# Patient Record
Sex: Male | Born: 1935 | Race: White | Hispanic: No | Marital: Married | Smoking: Former smoker
Health system: Southern US, Community
[De-identification: ages and names within clinical notes are randomized; demographics above are authoritative.]

## PROBLEM LIST (undated history)

## (undated) DIAGNOSIS — Z955 Presence of coronary angioplasty implant and graft: Secondary | ICD-10-CM

## (undated) DIAGNOSIS — M545 Low back pain: Secondary | ICD-10-CM

## (undated) DIAGNOSIS — I2510 Atherosclerotic heart disease of native coronary artery without angina pectoris: Secondary | ICD-10-CM

## (undated) DIAGNOSIS — G43909 Migraine, unspecified, not intractable, without status migrainosus: Secondary | ICD-10-CM

## (undated) DIAGNOSIS — K219 Gastro-esophageal reflux disease without esophagitis: Secondary | ICD-10-CM

## (undated) DIAGNOSIS — C61 Malignant neoplasm of prostate: Secondary | ICD-10-CM

## (undated) DIAGNOSIS — I10 Essential (primary) hypertension: Secondary | ICD-10-CM

## (undated) DIAGNOSIS — G8929 Other chronic pain: Secondary | ICD-10-CM

## (undated) DIAGNOSIS — M1990 Unspecified osteoarthritis, unspecified site: Secondary | ICD-10-CM

## (undated) DIAGNOSIS — C7951 Secondary malignant neoplasm of bone: Secondary | ICD-10-CM

## (undated) DIAGNOSIS — R42 Dizziness and giddiness: Secondary | ICD-10-CM

## (undated) DIAGNOSIS — E7800 Pure hypercholesterolemia, unspecified: Secondary | ICD-10-CM

## (undated) DIAGNOSIS — F32A Depression, unspecified: Secondary | ICD-10-CM

## (undated) DIAGNOSIS — F329 Major depressive disorder, single episode, unspecified: Secondary | ICD-10-CM

## (undated) DIAGNOSIS — R011 Cardiac murmur, unspecified: Secondary | ICD-10-CM

## (undated) DIAGNOSIS — F419 Anxiety disorder, unspecified: Secondary | ICD-10-CM

## (undated) HISTORY — DX: Unspecified osteoarthritis, unspecified site: M19.90

## (undated) HISTORY — DX: Presence of coronary angioplasty implant and graft: Z95.5

## (undated) HISTORY — DX: Dizziness and giddiness: R42

## (undated) HISTORY — DX: Cardiac murmur, unspecified: R01.1

## (undated) HISTORY — PX: TONSILLECTOMY: SUR1361

## (undated) HISTORY — DX: Gastro-esophageal reflux disease without esophagitis: K21.9

---

## 1959-04-19 HISTORY — PX: PILONIDAL CYST EXCISION: SHX744

## 2003-08-13 ENCOUNTER — Emergency Department (HOSPITAL_COMMUNITY): Admission: EM | Admit: 2003-08-13 | Discharge: 2003-08-13 | Payer: Self-pay | Admitting: Emergency Medicine

## 2003-12-05 ENCOUNTER — Emergency Department (HOSPITAL_COMMUNITY): Admission: EM | Admit: 2003-12-05 | Discharge: 2003-12-05 | Payer: Self-pay | Admitting: Emergency Medicine

## 2003-12-22 ENCOUNTER — Ambulatory Visit (HOSPITAL_COMMUNITY): Admission: RE | Admit: 2003-12-22 | Discharge: 2003-12-22 | Payer: Self-pay | Admitting: Otolaryngology

## 2005-08-18 HISTORY — PX: PROSTATECTOMY: SHX69

## 2006-05-22 ENCOUNTER — Emergency Department (HOSPITAL_COMMUNITY): Admission: EM | Admit: 2006-05-22 | Discharge: 2006-05-22 | Payer: Self-pay | Admitting: Family Medicine

## 2006-06-26 ENCOUNTER — Ambulatory Visit: Admission: RE | Admit: 2006-06-26 | Discharge: 2006-07-23 | Payer: Self-pay | Admitting: Radiation Oncology

## 2006-06-26 ENCOUNTER — Ambulatory Visit (HOSPITAL_COMMUNITY): Admission: RE | Admit: 2006-06-26 | Discharge: 2006-06-26 | Payer: Self-pay | Admitting: Urology

## 2006-08-05 ENCOUNTER — Inpatient Hospital Stay (HOSPITAL_COMMUNITY): Admission: RE | Admit: 2006-08-05 | Discharge: 2006-08-07 | Payer: Self-pay | Admitting: Urology

## 2006-08-05 ENCOUNTER — Encounter (INDEPENDENT_AMBULATORY_CARE_PROVIDER_SITE_OTHER): Payer: Self-pay | Admitting: Specialist

## 2007-04-03 IMAGING — CR DG CHEST 2V
2 series · 2 of 2 positions shown · non-contrast
Comparison: None.

CLINICAL DATA: Prostate cancer. Preoperative. 
 CHEST - 2 VIEW:

[view not recorded (1 of 2)]
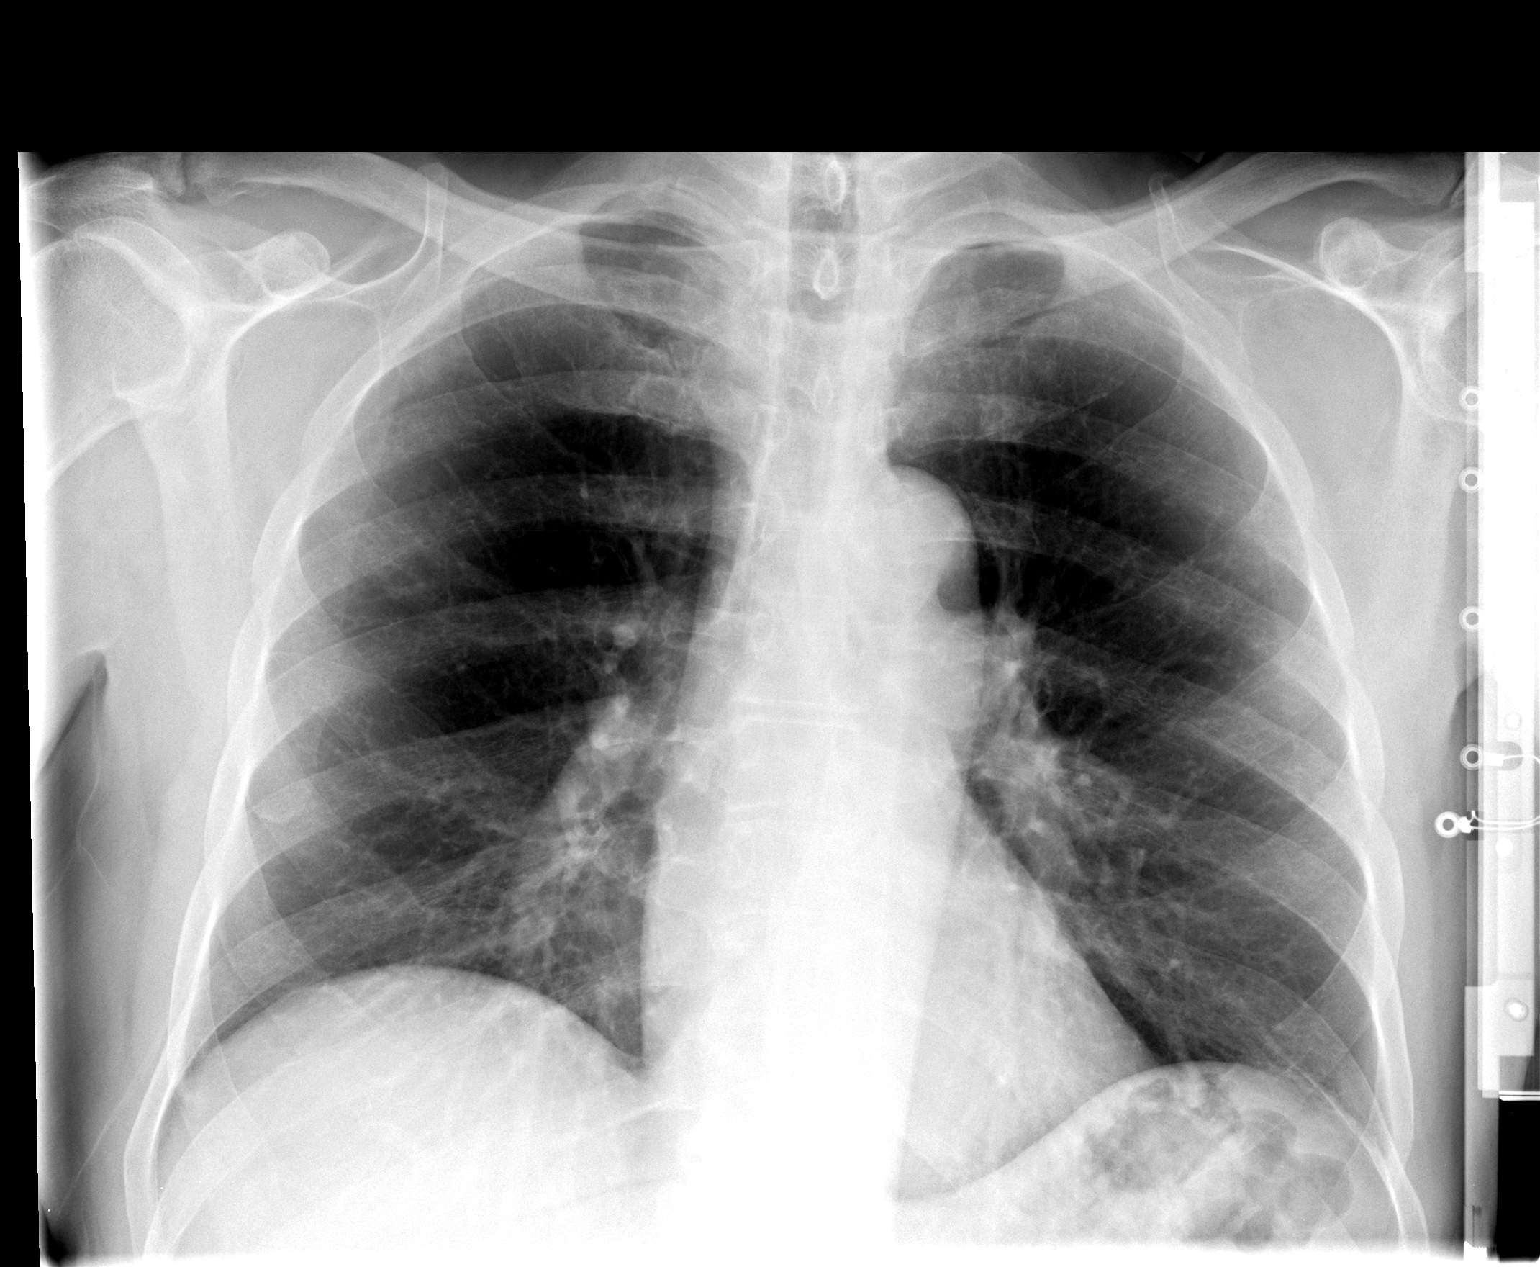

[view not recorded (2 of 2)]
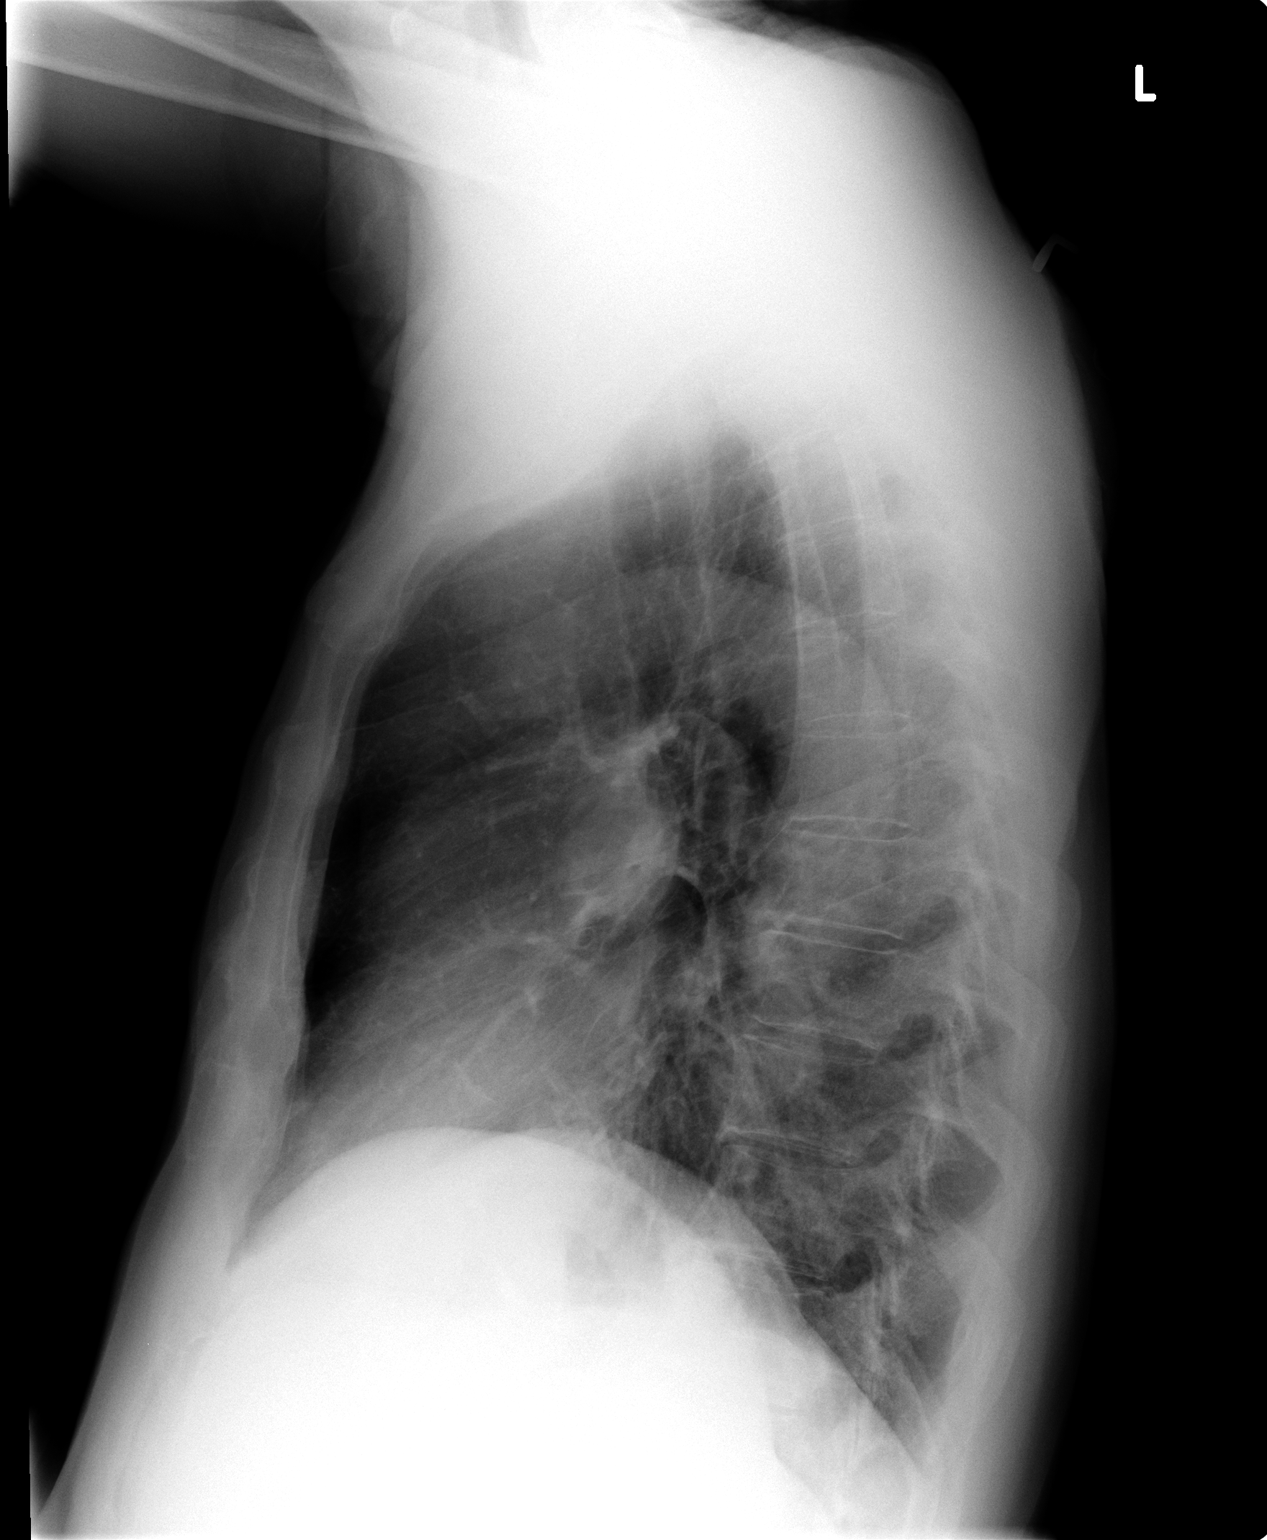

[2 of 2 positions shown; findings below may reference images not displayed]

FINDINGS: The heart size and mediastinal contours are within normal limits.  Both lungs are clear.  The visualized skeletal structures are unremarkable. Degenerative changes noted at the right acromioclavicular joint.
IMPRESSION: No active cardiopulmonary disease.

## 2007-12-17 ENCOUNTER — Emergency Department (HOSPITAL_COMMUNITY): Admission: EM | Admit: 2007-12-17 | Discharge: 2007-12-17 | Payer: Self-pay | Admitting: Emergency Medicine

## 2010-11-05 ENCOUNTER — Emergency Department (HOSPITAL_BASED_OUTPATIENT_CLINIC_OR_DEPARTMENT_OTHER): Payer: Federal, State, Local not specified - PPO

## 2010-11-05 ENCOUNTER — Emergency Department (HOSPITAL_BASED_OUTPATIENT_CLINIC_OR_DEPARTMENT_OTHER)
Admission: EM | Admit: 2010-11-05 | Discharge: 2010-11-05 | Disposition: A | Discharge status: Home / Self Care | Payer: Federal, State, Local not specified - PPO | Attending: Emergency Medicine | Admitting: Emergency Medicine

## 2010-11-05 DIAGNOSIS — Z79899 Other long term (current) drug therapy: Secondary | ICD-10-CM | POA: Insufficient documentation

## 2010-11-05 DIAGNOSIS — M25529 Pain in unspecified elbow: Secondary | ICD-10-CM

## 2010-11-05 DIAGNOSIS — IMO0002 Reserved for concepts with insufficient information to code with codable children: Secondary | ICD-10-CM | POA: Insufficient documentation

## 2010-11-05 DIAGNOSIS — Y92009 Unspecified place in unspecified non-institutional (private) residence as the place of occurrence of the external cause: Secondary | ICD-10-CM | POA: Insufficient documentation

## 2011-01-03 NOTE — Op Note (Signed)
NAME:  Frank Petersen, MIMBS NO.:  [id number]   MEDICAL RECORD NO.:  [id number]          PATIENT TYPE:  INP   LOCATION:  1437                         FACILITY:  [facility]   PHYSICIAN:  Lucrezia Starch. Earlene Plater, M.D.  DATE OF BIRTH:  Sep 20, 1932   DATE OF PROCEDURE:  08/05/2006  DATE OF DISCHARGE:                               OPERATIVE REPORT   DIAGNOSIS:  Adenocarcinoma of prostate.   OPERATIVE PROCEDURE:  Robotic radical prostatectomy with bilateral  pelvic lymphadenectomy.   SURGEON:  Lucrezia Starch. Earlene Plater, M.D.   ASSISTANT:  Crecencio Mc, M.D.   ANESTHESIA:  General endotracheal.   ESTIMATED BLOOD LOSS:  150 mL.   TUBES:  Large round Blake drain, 20-French Coude catheter.   COMPLICATIONS:  None.   INDICATIONS FOR PROCEDURE:  Mr. Millirons is a very nice 77 year old white  male who presented to Dr. Payton Mccallum where he was found to have an  elevated PSA of 6.45 and a palpable abnormality on the right side of his  prostate. Biopsies revealed a Gleason score 7 which was 3+4  adenocarcinoma bilaterally.  He is quite healthy and his metastatic  workup was negative.  After understanding risks, benefits and  alternatives, he has elected to proceed with robotic radical  prostatectomy.   PROCEDURE IN DETAIL:  The patient was placed in the supine position.  After proper general endotracheal anesthesia, he was placed in the  dorsal lithotomy position, prepped and prepped with Betadine in a  sterile fashion.  A 22-French Foley catheter was inserted with a 30 mL  balloon and the bladder was drained.  A left periumbilical incision was  made approximately 1 cm in length.  The peritoneum was entered and a 8-  [place] port was placed in the [place] technique, insufflated with carbon  dioxide.  The abdomen was then examined. He was noted to have some  adhesions in the right pelvis and, also, some adhesions to the right  lower quadrant. These were taken down with scissors after a 5-mm  port  had been placed just superior and lateral under vision into the  peritoneal cavity.  The areas were cauterized with Bovie coagulation  cautery.  Next, robotic ports were placed one handbreadth laterally  bilaterally, approximately 16 cm from the pubic symphysis, the  periumbilical port was approximately 18 cm from pubic symphysis and  visualized in place.  A third arm port was placed lateral to the left  robotic arm port and another 12 mm port was placed lateral to the right  robotic arm port.  The robot was attached and dissection was begun.  Approximately 200 mL of sterile water was placed into the bladder.   The bladder flap was taken down along with the median and medial  umbilical ligaments to the space of Retzius.  This was entered and  dissected free.  The endopelvic fascia was incised bilaterally and  sections were carried down to the dorsal vein complex. Pudendal vessels  were taken down sharply and with Bovie coagulation cautery.  The dorsal  vein complex was then clamped with an Endo-GIA stapler and cut and noted  to be in good position.  The bladder neck was then approached and  sharply dissected to the catheter.  The catheter was used as a traction  device.  The posterior bladder neck was taken down.  The seminal  vesicles and ampulla of the vas deferens were taken down and used as  traction devices.  Each pedicle was identified.  The patient has had  erectile problems, has bilateral tumor.  However, the neurovascular  bundle appeared to be not involved and came down easily bilaterally.  The pedicles were taken in serial clamps with Hem-A-Lock clips and taken  up to the apex of the prostate maintaining the pedicle and what appeared  to be a wide margin on the prostate.  The apex was then approached and  dissected.  The ureter was sharply cut anteriorly and posteriorly. The  posterior urethral plate was taken down. The specimen was freed and  placed in the peritoneal  cavity.  The pelvis was filled with irrigation  and the rectum was insufflated and there were no leaks noted.  Good  hemostasis was noted to be present.   Bilateral pelvic lymphadenectomy was performed under direct vision.  The  obturator and external iliac lymph nodes were taken down, clipped both  proximally and distally, obturator nerves were identified and protected  bilaterally.  The specimens were obtained with the cup device and  submitted to pathology.  The urethrovesical anastomosis was then  performed.  The bladder neck appeared to be very small and the same size  as the urethra.  A holding stitch was placed with 2-0 Vicryl suture  posteriorly and the suture was performed with 3-0 Monocryl utilizing an  RB1 needle. The dyed and undyed segments in the knot were placed  posterior to the bladder neck. Running sutures were performed and tied  anteriorly.  The bladder was irrigated clear.  There was no leakage  noted.  Good hemostasis was noted to be present. Under direct vision,  the specimen was captured with an EndoCatch bag through the  periumbilical port.  The 12 mm right port was closed under direct vision  with a suture passer and 2-0 Vicryl suture.  Each port was visualized as  it was removed and there was good hemostasis.  The specimen was then  removed through the periumbilical incision and the fascia was closed  with running 2-0 Vicryl suture.  Each wound was injected with 0.25%  Marcaine and closed with skin staples.  The patient was taken to the  recovery room stable.      Ronald L. Earlene Plater, M.D.  Electronically Signed     RLD/MEDQ  D:  08/05/2006  T:  08/05/2006  Job:  [id number]

## 2011-01-03 NOTE — H&P (Signed)
NAME:  Frank, Petersen NO.:  [id number]   MEDICAL RECORD NO.:  [id number]          PATIENT TYPE:  INP   LOCATION:  X001                         FACILITY:  [facility]   PHYSICIAN:  Lucrezia Starch. Earlene Plater, M.D.  DATE OF BIRTH:  03-31-1933   DATE OF ADMISSION:  08/05/2006  DATE OF DISCHARGE:                              HISTORY & PHYSICAL   CHIEF COMPLAINT:  I have prostate cancer.   HISTORY OF PRESENT ILLNESS:  Frank Petersen is a very nice 77 year old,  white male who presented with an elevated PSA of 6.45.  He also had a  palpably abnormal prostate with some firmness on the right side.  He  underwent biopsy of the prostate which revealed a Gleason's score 7  which was 3 + 4 adenocarcinoma bilaterally.  His metastatic workup was  negative and, after understanding risks, benefits, and alternatives,  elected to proceed with robotic radical prostatectomy and pelvic  lymphadenectomy.   PAST MEDICAL HISTORY:  He is ALLERGIC TO CODEINE AND ASPIRIN.   MEDICATIONS:  Include:  1. Crestor.  2. Lorazepam.  3. Promethazine.  4. Protonix.  5. Sertraline.  6. Topamax.  7. B-complex vitamins.   ILLNESSES:  He has had a heart murmur, hiatal hernia with reflux,  arthritis, and some inner ear vertigo with migraines.   SURGERIES:  He has had a tonsillectomy and a pilonidal cystectomy in the  1960s.   SOCIAL HISTORY:  He has 2 beers per day, negative tobacco.   FAMILY HISTORY:  Nonsignificant.   REVIEW OF SYSTEMS:  He has no shortness of breath, dyspnea on exertion,  chest pain, or GI complaints.   PHYSICAL EXAMINATION:  Blood pressure is 125/73, pulse 64, respirations  16, temperature 97.3 degrees Fahrenheit.  GENERAL:  He is a well-nourished, well-developed, well-groomed, oriented  x3.  HEAD, EYES, EARS, NOSE:  Normal.  NECK:  Without masses or thyromegaly.  CHEST:  Normal diaphragmatic motion.  ABDOMEN:  Soft, nontender without masses, organomegaly, or hernias.  EXTREMITIES:   Normal.  NEURO:  Intact.  SKIN:  Normal.  Penis, meatus, scrotum, testicle, adnexa, anus, perineum are normal.  RECTAL:  __________ prostate 35 g, some firmness noted on the right  side.   IMPRESSION:  Clinical stage T2 adenocarcinoma of the prostate, Gleason's  score 7.   PLAN:  Radical robotic prostatectomy with bilateral pelvic  lymphadenopathy.      Ronald L. Earlene Plater, M.D.  Electronically Signed     RLD/MEDQ  D:  08/05/2006  T:  08/05/2006  Job:  [id number]

## 2012-03-18 ENCOUNTER — Emergency Department (HOSPITAL_BASED_OUTPATIENT_CLINIC_OR_DEPARTMENT_OTHER)
Admission: EM | Admit: 2012-03-18 | Discharge: 2012-03-18 | Disposition: A | Discharge status: Home / Self Care | Payer: Federal, State, Local not specified - PPO | Attending: Emergency Medicine | Admitting: Emergency Medicine

## 2012-03-18 DIAGNOSIS — Y849 Medical procedure, unspecified as the cause of abnormal reaction of the patient, or of later complication, without mention of misadventure at the time of the procedure: Secondary | ICD-10-CM | POA: Insufficient documentation

## 2012-03-18 DIAGNOSIS — T8131XA Disruption of external operation (surgical) wound, not elsewhere classified, initial encounter: Secondary | ICD-10-CM | POA: Insufficient documentation

## 2012-03-18 DIAGNOSIS — L918 Other hypertrophic disorders of the skin: Secondary | ICD-10-CM | POA: Insufficient documentation

## 2012-03-18 DIAGNOSIS — Z85820 Personal history of malignant melanoma of skin: Secondary | ICD-10-CM | POA: Insufficient documentation

## 2012-03-18 NOTE — ED Provider Notes (Signed)
Medical screening examination/treatment/procedure(s) were conducted as a shared visit with non-physician practitioner(s) and myself.  I personally evaluated the patient during the encounter  S/p melanoma excision, sutures removed 2 days ago at [place]. Wound dehiscence today. Healed wound edges with granulation tissue. No evidence of infection. Needs to see surgeon.  Glynn Octave, MD 03/18/12 [phone]

## 2012-03-18 NOTE — ED Notes (Signed)
Pt reports skin biopsy on 02/15/2012, sutures removed 2 days ago, pt states he bent over this morning and felt "a tightness". bandaid removed from site to right upper scapula to reveal approx 3 inch open wound with tissue visible. No drainage, bleeding, or redness noted. Pt denies any pain.

## 2012-03-18 NOTE — ED Provider Notes (Signed)
History     CSN: [id number]  Arrival date & time 03/18/12  1157   First MD Initiated Contact with Patient 03/18/12 1223      Chief Complaint  Patient presents with  . Wound Check    (Consider location/radiation/quality/duration/timing/severity/associated sxs/prior treatment) Patient is a 78 y.o. male presenting with wound check. The history is provided by the patient. No language interpreter was used.  Wound Check  Treated in ED: melanoma removal 4 weeks ago, surgery 2 weeks ago. sutures removed 2 days ago.     Wound reopened last pm.   No past medical history on file.  No past surgical history on file.  No family history on file.  History  Substance Use Topics  . Smoking status: Not on file  . Smokeless tobacco: Not on file  . Alcohol Use: Not on file      Review of Systems  Skin: Positive for wound.  All other systems reviewed and are negative.    Allergies  Codeine  Home Medications   Current Outpatient Rx  Name Route Sig Dispense Refill  . CLONAZEPAM 0.5 MG PO TABS Oral Take 0.5 mg by mouth 2 (two) times daily as needed.    Marland Kitchen HYDROCODONE-ACETAMINOPHEN 5-500 MG PO TABS Oral Take 1 tablet by mouth every 6 (six) hours as needed.    Marland Kitchen MECLIZINE HCL 12.5 MG PO TABS Oral Take 12.5 mg by mouth 3 (three) times daily as needed.    Marland Kitchen METFORMIN HCL 500 MG PO TABS Oral Take 500 mg by mouth 2 (two) times daily with a meal.    . NAPROXEN 500 MG PO TABS Oral Take 500 mg by mouth 2 (two) times daily with a meal.    . OMEPRAZOLE 20 MG PO CPDR Oral Take 20 mg by mouth daily.    Marland Kitchen ROSUVASTATIN CALCIUM 20 MG PO TABS Oral Take 20 mg by mouth daily.    . SERTRALINE HCL 100 MG PO TABS Oral Take 100 mg by mouth daily.      BP 167/95  Pulse 67  Temp 98.2 F (36.8 C) (Oral)  Resp 18  SpO2 100%  Physical Exam  Nursing note and vitals reviewed. Constitutional: He is oriented to person, place, and time. He appears well-developed and well-nourished.  Musculoskeletal: He  exhibits tenderness.       3 inch long, open granulation tissue wound, wound edges healed,  Neurological: He is alert and oriented to person, place, and time. He has normal reflexes.  Skin: There is erythema.  Psychiatric: He has a normal mood and affect.    ED Course  Procedures (including critical care time)  Labs Reviewed - No data to display No results found.   No diagnosis found.    MDM  Pt seen by Dr. Manus Gunning, He advised clean and steri strip,   Pt advised to call his surgeon to be seen.   I do not think resuturing will be of benefit due to healed and granulation tissue,  Wound cleaned and steristrips         Lonia Skinner [place], [place] 03/18/12 1333

## 2012-12-03 ENCOUNTER — Telehealth (HOSPITAL_COMMUNITY): Payer: Self-pay | Admitting: *Deleted

## 2012-12-03 ENCOUNTER — Emergency Department (HOSPITAL_COMMUNITY)
Admission: EM | Admit: 2012-12-03 | Discharge: 2012-12-03 | Disposition: A | Discharge status: Home / Self Care | Payer: Federal, State, Local not specified - PPO | Source: Home / Self Care | Attending: Family Medicine | Admitting: Family Medicine

## 2012-12-03 ENCOUNTER — Encounter (HOSPITAL_COMMUNITY): Payer: Self-pay | Admitting: Emergency Medicine

## 2012-12-03 DIAGNOSIS — H1010 Acute atopic conjunctivitis, unspecified eye: Secondary | ICD-10-CM

## 2012-12-03 DIAGNOSIS — H1013 Acute atopic conjunctivitis, bilateral: Secondary | ICD-10-CM

## 2012-12-03 DIAGNOSIS — J309 Allergic rhinitis, unspecified: Secondary | ICD-10-CM

## 2012-12-03 LAB — POCT RAPID STREP A: Streptococcus, Group A Screen (Direct): NEGATIVE

## 2012-12-03 MED ORDER — AZITHROMYCIN 250 MG PO TABS: ORAL_TABLET | ORAL | Status: DC

## 2012-12-03 MED ORDER — FEXOFENADINE HCL 180 MG PO TABS: 180.0000 mg | ORAL_TABLET | Freq: Every day | ORAL | Status: DC

## 2012-12-03 MED ORDER — FLUTICASONE PROPIONATE 50 MCG/ACT NA SUSP: 1.0000 | Freq: Two times a day (BID) | NASAL | Status: DC

## 2012-12-03 MED ORDER — OLOPATADINE HCL 0.1 % OP SOLN: 1.0000 [drp] | Freq: Two times a day (BID) | OPHTHALMIC | Status: DC

## 2012-12-03 NOTE — ED Provider Notes (Signed)
History     CSN: [id number]  Arrival date & time 12/03/12  1002   First MD Initiated Contact with Patient 12/03/12 1021      Chief Complaint  Patient presents with  . URI    (Consider location/radiation/quality/duration/timing/severity/associated sxs/prior treatment) Patient is a 79 y.o. male presenting with URI. The history is provided by the patient.  URI Presenting symptoms: congestion, cough, rhinorrhea and sore throat   Presenting symptoms: no fever   Severity:  Mild Duration:  6 days Progression:  Unchanged Chronicity:  New Associated symptoms: sneezing   Associated symptoms: no wheezing     History reviewed. No pertinent past medical history.  History reviewed. No pertinent past surgical history.  History reviewed. No pertinent family history.  History  Substance Use Topics  . Smoking status: Never Smoker   . Smokeless tobacco: Not on file  . Alcohol Use: No      Review of Systems  Constitutional: Negative.  Negative for fever.  HENT: Positive for congestion, sore throat, rhinorrhea and sneezing.   Respiratory: Positive for cough. Negative for wheezing.   Cardiovascular: Negative.   Gastrointestinal: Negative.     Allergies  Codeine  Home Medications   Current Outpatient Rx  Name  Route  Sig  Dispense  Refill  . azithromycin (ZITHROMAX Z-PAK) 250 MG tablet      Take as directed on pack   6 each   0   . clonazePAM (KLONOPIN) 0.5 MG tablet   Oral   Take 0.5 mg by mouth 2 (two) times daily as needed.         . fexofenadine (ALLEGRA) 180 MG tablet   Oral   Take 1 tablet (180 mg total) by mouth daily.   30 tablet   1   . fluticasone (FLONASE) 50 MCG/ACT nasal spray   Nasal   Place 1 spray into the nose 2 (two) times daily.   1 g   2   . HYDROcodone-acetaminophen (VICODIN) 5-500 MG per tablet   Oral   Take 1 tablet by mouth every 6 (six) hours as needed.         . meclizine (ANTIVERT) 12.5 MG tablet   Oral   Take 12.5 mg by  mouth 3 (three) times daily as needed.         . metFORMIN (GLUCOPHAGE) 500 MG tablet   Oral   Take 500 mg by mouth 2 (two) times daily with a meal.         . naproxen (NAPROSYN) 500 MG tablet   Oral   Take 500 mg by mouth 2 (two) times daily with a meal.         . olopatadine (PATANOL) 0.1 % ophthalmic solution   Both Eyes   Place 1 drop into both eyes 2 (two) times daily.   5 mL   12   . omeprazole (PRILOSEC) 20 MG capsule   Oral   Take 20 mg by mouth daily.         . rosuvastatin (CRESTOR) 20 MG tablet   Oral   Take 20 mg by mouth daily.         . sertraline (ZOLOFT) 100 MG tablet   Oral   Take 100 mg by mouth daily.           BP 132/74  Pulse 80  Temp(Src) 97.8 F (36.6 C) (Oral)  Resp 20  SpO2 98%  Physical Exam  Nursing note and vitals reviewed. Constitutional: He is  oriented to person, place, and time. He appears well-developed and well-nourished.  HENT:  Head: Normocephalic.  Right Ear: External ear normal.  Left Ear: External ear normal.  Nose: Mucosal edema and rhinorrhea present.  Mouth/Throat: Oropharynx is clear and moist.  Eyes: EOM are normal. Pupils are equal, round, and reactive to light. Right conjunctiva is injected. Left conjunctiva is injected.  Neck: Neck supple.  Cardiovascular: Regular rhythm and normal heart sounds.   Pulmonary/Chest: Effort normal and breath sounds normal.  Lymphadenopathy:    He has no cervical adenopathy.  Neurological: He is alert and oriented to person, place, and time.  Skin: Skin is warm and dry.    ED Course  Procedures (including critical care time)  Labs Reviewed  POCT RAPID STREP A ([facility] CARE ONLY)   No results found.   1. Allergic conjunctivitis and rhinitis, bilateral       MDM          Linna Hoff, MD 12/03/12 1049

## 2012-12-03 NOTE — ED Notes (Signed)
Pt c/o sx including sore throat, runny nose, productive cough with yellowish greenish phelgm, and lack of energy x 6 days. Denies fever. Last night felt tightness in chest that made him SOB. Tried using [employer] with no relief. Patient is alert and oriented.

## 2012-12-03 NOTE — ED Notes (Signed)
Pt.'s wife called x 2 on VM and said he signed a medical release form and wants report on his condition.  I verified release form was signed and called her back. She thought he had some kind of flu or other URI and the d/c paper say allergic conjunctivitis and rhinitis. I told her his strep screen was negative but the doctor must have thought he had some kind of URI because he gave him a Z-pak. She said he filled all the Rx.'s except the Patanol because it was to expensive. She said she used 2 drops of her Tobramycin eye drops she had left over. I did not recommend she do that.  I told her I could try to get the Patanol changed to a different eye drop that was cheaper and she said no she will just watch it. I told her if he is not better after the Z-pak, Flonase and Allegra to f/u with his PCP. He can use Delsym for the cough.  She voiced understanding. Frank Petersen 12/03/2012

## 2013-08-31 ENCOUNTER — Encounter: Payer: Self-pay | Admitting: Cardiovascular Disease

## 2013-08-31 ENCOUNTER — Encounter: Payer: Self-pay | Admitting: *Deleted

## 2013-08-31 DIAGNOSIS — R42 Dizziness and giddiness: Secondary | ICD-10-CM | POA: Insufficient documentation

## 2013-08-31 DIAGNOSIS — M1990 Unspecified osteoarthritis, unspecified site: Secondary | ICD-10-CM | POA: Insufficient documentation

## 2013-08-31 DIAGNOSIS — K219 Gastro-esophageal reflux disease without esophagitis: Secondary | ICD-10-CM | POA: Insufficient documentation

## 2013-08-31 DIAGNOSIS — C61 Malignant neoplasm of prostate: Secondary | ICD-10-CM | POA: Insufficient documentation

## 2013-08-31 DIAGNOSIS — R011 Cardiac murmur, unspecified: Secondary | ICD-10-CM | POA: Insufficient documentation

## 2013-09-01 ENCOUNTER — Ambulatory Visit: Payer: Federal, State, Local not specified - PPO | Admitting: Cardiovascular Disease

## 2014-04-27 ENCOUNTER — Emergency Department (HOSPITAL_BASED_OUTPATIENT_CLINIC_OR_DEPARTMENT_OTHER): Payer: Federal, State, Local not specified - PPO

## 2014-04-27 ENCOUNTER — Encounter (HOSPITAL_BASED_OUTPATIENT_CLINIC_OR_DEPARTMENT_OTHER): Payer: Self-pay | Admitting: Emergency Medicine

## 2014-04-27 ENCOUNTER — Emergency Department (HOSPITAL_BASED_OUTPATIENT_CLINIC_OR_DEPARTMENT_OTHER)
Admission: EM | Admit: 2014-04-27 | Discharge: 2014-04-27 | Disposition: A | Discharge status: Home / Self Care | Payer: Federal, State, Local not specified - PPO | Attending: Emergency Medicine | Admitting: Emergency Medicine

## 2014-04-27 DIAGNOSIS — H8309 Labyrinthitis, unspecified ear: Secondary | ICD-10-CM | POA: Insufficient documentation

## 2014-04-27 DIAGNOSIS — R011 Cardiac murmur, unspecified: Secondary | ICD-10-CM | POA: Insufficient documentation

## 2014-04-27 DIAGNOSIS — M129 Arthropathy, unspecified: Secondary | ICD-10-CM | POA: Insufficient documentation

## 2014-04-27 DIAGNOSIS — IMO0002 Reserved for concepts with insufficient information to code with codable children: Secondary | ICD-10-CM | POA: Insufficient documentation

## 2014-04-27 DIAGNOSIS — Z792 Long term (current) use of antibiotics: Secondary | ICD-10-CM | POA: Insufficient documentation

## 2014-04-27 DIAGNOSIS — H9312 Tinnitus, left ear: Secondary | ICD-10-CM

## 2014-04-27 DIAGNOSIS — K219 Gastro-esophageal reflux disease without esophagitis: Secondary | ICD-10-CM | POA: Insufficient documentation

## 2014-04-27 DIAGNOSIS — Z791 Long term (current) use of non-steroidal anti-inflammatories (NSAID): Secondary | ICD-10-CM | POA: Insufficient documentation

## 2014-04-27 DIAGNOSIS — H8302 Labyrinthitis, left ear: Secondary | ICD-10-CM

## 2014-04-27 DIAGNOSIS — H60399 Other infective otitis externa, unspecified ear: Secondary | ICD-10-CM | POA: Insufficient documentation

## 2014-04-27 DIAGNOSIS — Z79899 Other long term (current) drug therapy: Secondary | ICD-10-CM | POA: Insufficient documentation

## 2014-04-27 DIAGNOSIS — Z8546 Personal history of malignant neoplasm of prostate: Secondary | ICD-10-CM | POA: Insufficient documentation

## 2014-04-27 DIAGNOSIS — H6092 Unspecified otitis externa, left ear: Secondary | ICD-10-CM

## 2014-04-27 DIAGNOSIS — H9209 Otalgia, unspecified ear: Secondary | ICD-10-CM | POA: Insufficient documentation

## 2014-04-27 DIAGNOSIS — H9319 Tinnitus, unspecified ear: Secondary | ICD-10-CM | POA: Insufficient documentation

## 2014-04-27 DIAGNOSIS — R42 Dizziness and giddiness: Secondary | ICD-10-CM | POA: Insufficient documentation

## 2014-04-27 LAB — CBC
HEMATOCRIT: 40 % (ref 39.0–52.0)
HEMOGLOBIN: 13.7 g/dL (ref 13.0–17.0)
MCH: 28.9 pg (ref 26.0–34.0)
MCHC: 34.3 g/dL (ref 30.0–36.0)
MCV: 84.4 fL (ref 78.0–100.0)
Platelets: 312 10*3/uL (ref 150–400)
RBC: 4.74 MIL/uL (ref 4.22–5.81)
RDW: 13.1 % (ref 11.5–15.5)
WBC: 2.3 10*3/uL — ABNORMAL LOW (ref 4.0–10.5)

## 2014-04-27 LAB — BASIC METABOLIC PANEL
Anion gap: 12 (ref 5–15)
BUN: 23 mg/dL (ref 6–23)
CALCIUM: 8.9 mg/dL (ref 8.4–10.5)
CO2: 24 mEq/L (ref 19–32)
CREATININE: 0.7 mg/dL (ref 0.50–1.35)
Chloride: 100 mEq/L (ref 96–112)
GFR calc Af Amer: >90 mL/min (ref >90)
GFR, EST NON AFRICAN AMERICAN: 87 mL/min — AB (ref >90)
GLUCOSE: 135 mg/dL — AB (ref 70–99)
Potassium: 4.4 mEq/L (ref 3.7–5.3)
Sodium: 136 mEq/L — ABNORMAL LOW (ref 137–147)

## 2014-04-27 MED ORDER — CIPROFLOXACIN-HYDROCORTISONE 0.2-1 % OT SUSP: 3.0000 [drp] | Freq: Two times a day (BID) | OTIC | Status: DC

## 2014-04-27 MED ORDER — SODIUM CHLORIDE 0.9 % IV BOLUS (SEPSIS)
500.0000 mL | Freq: Once | INTRAVENOUS | Status: AC
Start: 2014-04-27 — End: 2014-04-27
  Administered 2014-04-27: 500 mL via INTRAVENOUS

## 2014-04-27 MED ORDER — DIAZEPAM 5 MG PO TABS: 5.0000 mg | ORAL_TABLET | Freq: Four times a day (QID) | ORAL | Status: DC | PRN

## 2014-04-27 MED ORDER — MECLIZINE HCL 25 MG PO TABS
25.0000 mg | ORAL_TABLET | Freq: Once | ORAL | Status: AC
  Administered 2014-04-27: 25 mg via ORAL
  Filled 2014-04-27: qty 1

## 2014-04-27 NOTE — ED Notes (Signed)
MD at bedside. 

## 2014-04-27 NOTE — ED Provider Notes (Signed)
CSN: [id number]     Arrival date & time 04/27/14  1616 History   First MD Initiated Contact with Patient 04/27/14 1653     Chief Complaint  Patient presents with  . Otalgia     (Consider location/radiation/quality/duration/timing/severity/associated sxs/prior Treatment) Patient is a 80 y.o. male presenting with ear pain. The history is provided by the patient.  Otalgia Location:  Left Behind ear:  No abnormality Quality: tinnitus. Severity:  Mild Onset quality:  Gradual Timing:  Intermittent Progression:  Improving Chronicity:  New Relieved by: finger manipulating ear canal. Worsened by:  Nothing tried Associated symptoms: no cough, no fever and no vomiting     Past Medical History  Diagnosis Date  . Prostate cancer   . Heart murmur   . Hiatal hernia   . GERD (gastroesophageal reflux disease)   . Arthritis   . Vertigo    Past Surgical History  Procedure Laterality Date  . Prostatectomy    . Tonsillectomy    . Cystectomy     No family history on file. History  Substance Use Topics  . Smoking status: Never Smoker   . Smokeless tobacco: Not on file  . Alcohol Use: No    Review of Systems  Constitutional: Negative for fever.  HENT: Positive for ear pain.   Respiratory: Negative for cough and shortness of breath.   Gastrointestinal: Negative for vomiting.  All other systems reviewed and are negative.     Allergies  Codeine  Home Medications   Prior to Admission medications   Medication Sig Start Date End Date Taking? Authorizing Provider  azithromycin (ZITHROMAX Z-PAK) 250 MG tablet Take as directed on pack 12/03/12   Billy Fischer, MD  clonazePAM (KLONOPIN) 0.5 MG tablet Take 0.5 mg by mouth 2 (two) times daily as needed.    Historical Provider, MD  fexofenadine (ALLEGRA) 180 MG tablet Take 1 tablet (180 mg total) by mouth daily. 12/03/12   Billy Fischer, MD  fluticasone (FLONASE) 50 MCG/ACT nasal spray Place 1 spray into the nose 2 (two) times daily.  12/03/12   Billy Fischer, MD  HYDROcodone-acetaminophen (VICODIN) 5-500 MG per tablet Take 1 tablet by mouth every 6 (six) hours as needed.    Historical Provider, MD  meclizine (ANTIVERT) 12.5 MG tablet Take 12.5 mg by mouth 3 (three) times daily as needed.    Historical Provider, MD  metFORMIN (GLUCOPHAGE) 500 MG tablet Take 500 mg by mouth 2 (two) times daily with a meal.    Historical Provider, MD  naproxen (NAPROSYN) 500 MG tablet Take 500 mg by mouth 2 (two) times daily with a meal.    Historical Provider, MD  olopatadine (PATANOL) 0.1 % ophthalmic solution Place 1 drop into both eyes 2 (two) times daily. 12/03/12   Billy Fischer, MD  omeprazole (PRILOSEC) 20 MG capsule Take 20 mg by mouth daily.    Historical Provider, MD  rosuvastatin (CRESTOR) 20 MG tablet Take 20 mg by mouth daily.    Historical Provider, MD  sertraline (ZOLOFT) 100 MG tablet Take 100 mg by mouth daily.    Historical Provider, MD   BP 117/81  Pulse 121  Temp(Src) 97.9 F (36.6 C) (Oral)  Resp 20  Wt 176 lb (79.833 kg)  SpO2 100% Physical Exam  Constitutional: He is oriented to person, place, and time. He appears well-developed and well-nourished. No distress.  HENT:  Head: Normocephalic and atraumatic.  Mouth/Throat: No oropharyngeal exudate.  L TM with hair in canal, L  canal redness, TM appears well.  Eyes: EOM are normal. Pupils are equal, round, and reactive to light.  Neck: Normal range of motion. Neck supple.  Cardiovascular: Normal rate and regular rhythm.  Exam reveals no friction rub.   No murmur heard. Pulmonary/Chest: Effort normal and breath sounds normal. No respiratory distress. He has no wheezes. He has no rales.  Abdominal: He exhibits no distension. There is no tenderness. There is no rebound.  Musculoskeletal: Normal range of motion. He exhibits no edema.  Neurological: He is alert and oriented to person, place, and time.  Skin: He is not diaphoretic.    ED Course  Procedures (including  critical care time) Labs Review Labs Reviewed - No data to display  Imaging Review Ct Head Wo Contrast  04/27/2014   CLINICAL DATA:  Dizziness.  Tinnitus.  EXAM: CT HEAD WITHOUT CONTRAST  TECHNIQUE: Contiguous axial images were obtained from the base of the skull through the vertex without intravenous contrast.  COMPARISON:  12/05/2003  FINDINGS: There is no evidence of intracranial hemorrhage, brain edema, or other signs of acute infarction. There is no evidence of intracranial mass lesion or mass effect. No abnormal extraaxial fluid collections are identified.  Mild diffuse cerebral atrophy shows mild progression since previous exam. Ventricles are normal in size. No other intracranial abnormality identified. No skull fracture or other bone lesions identified.  IMPRESSION: No acute intracranial abnormality.  Mild cerebral atrophy.   Electronically Signed   By: Earle Gell M.D.   On: 04/27/2014 18:51     EKG Interpretation None      MDM   Final diagnoses:  Labyrinthitis of left ear  Tinnitus, left  Otitis externa, left    46M here with tinnitus, dizziness in L ear. Began earlier today. Improved after taking our hearing aid and putting finger in ear. Hx of prostate cancer, about a weekout from chemo. Review of his chemo drug does not show tinnitus as a side effect. Mild vertigo type dizziness with this. Will CT his head to ensure no intra-cranial issue. Ear canal is red c/w otitis externa. Some relief with meclizine and after cleaning out of his ear canal, but his tinnitus returned. Workup negative. I will refrain from steroids for possible labyrinthitis since he's on chemo. He's followed by Dr. Janace Hoard with ENT, recommended f/u with him soon. No bruits, improvement with manipulation of ear, doubt vascular etiology. I discussed doing CT Angio with him and wife but they felt it was ear related and didn't want pursue a CT at this time.  Patient given valium to go home, has meclizine. Given cipro  otix drops. Stable for discharge.   Evelina Bucy, MD 04/27/14 [phone]

## 2014-04-27 NOTE — Discharge Instructions (Signed)
Labyrinthitis (Inner Ear Inflammation) Your exam shows you have an inner ear disturbance or labyrinthitis. The cause of this condition is not known. But it may be due to a virus infection. The symptoms of labyrinthitis include vertigo or dizziness made worse by motion, nausea and vomiting. The onset of labyrinthitis may be very sudden. It usually lasts for a few days and then clears up over 1-2 weeks. The treatment of an inner ear disturbance includes bed rest and medications to reduce dizziness, nausea, and vomiting. You should stay away from alcohol, tranquilizers, caffeine, nicotine, or any medicine your doctor thinks may make your symptoms worse. Further testing may be needed to evaluate your hearing and balance system. Please see your doctor or go to the emergency room right away if you have:  Increasing vertigo, earache, loss of hearing, or ear drainage.  Headache, blurred vision, trouble walking, fainting, or fever.  Persistent vomiting, dehydration, or extreme weakness. Document Released: 08/04/2005 Document Revised: 10/27/2011 Document Reviewed: 01/20/2007 Exit[employer] Patient Information 2015 [employer]. This information is not intended to replace advice given to you by your health care provider. Make sure you discuss any questions you have with your health care provider.  

## 2014-04-27 NOTE — ED Notes (Signed)
Pt states roaring has returned to left ear,  If he opens ear w finger roaring is reduced

## 2014-04-27 NOTE — ED Notes (Signed)
Pain in his left ear. Ringing in her ear. Chemo yesterday. He also states he tripped and fell onto concrete last week. No loc.

## 2014-08-01 ENCOUNTER — Emergency Department (HOSPITAL_COMMUNITY): Payer: Non-veteran care

## 2014-08-01 ENCOUNTER — Encounter (HOSPITAL_COMMUNITY): Payer: Self-pay

## 2014-08-01 ENCOUNTER — Inpatient Hospital Stay (HOSPITAL_COMMUNITY)
Admission: EM | Admit: 2014-08-01 | Discharge: 2014-08-03 | DRG: 247 | Disposition: A | Discharge status: Home / Self Care | Payer: Non-veteran care | Attending: Family Medicine | Admitting: Family Medicine

## 2014-08-01 DIAGNOSIS — F329 Major depressive disorder, single episode, unspecified: Secondary | ICD-10-CM | POA: Diagnosis present

## 2014-08-01 DIAGNOSIS — I25110 Atherosclerotic heart disease of native coronary artery with unstable angina pectoris: Secondary | ICD-10-CM | POA: Diagnosis present

## 2014-08-01 DIAGNOSIS — Z79899 Other long term (current) drug therapy: Secondary | ICD-10-CM

## 2014-08-01 DIAGNOSIS — Z955 Presence of coronary angioplasty implant and graft: Secondary | ICD-10-CM

## 2014-08-01 DIAGNOSIS — C61 Malignant neoplasm of prostate: Secondary | ICD-10-CM | POA: Diagnosis present

## 2014-08-01 DIAGNOSIS — C7951 Secondary malignant neoplasm of bone: Secondary | ICD-10-CM | POA: Diagnosis present

## 2014-08-01 DIAGNOSIS — I214 Non-ST elevation (NSTEMI) myocardial infarction: Principal | ICD-10-CM | POA: Diagnosis present

## 2014-08-01 DIAGNOSIS — E780 Pure hypercholesterolemia: Secondary | ICD-10-CM | POA: Diagnosis present

## 2014-08-01 DIAGNOSIS — I10 Essential (primary) hypertension: Secondary | ICD-10-CM | POA: Diagnosis present

## 2014-08-01 DIAGNOSIS — Z79891 Long term (current) use of opiate analgesic: Secondary | ICD-10-CM | POA: Diagnosis not present

## 2014-08-01 DIAGNOSIS — E782 Mixed hyperlipidemia: Secondary | ICD-10-CM | POA: Diagnosis not present

## 2014-08-01 DIAGNOSIS — K219 Gastro-esophageal reflux disease without esophagitis: Secondary | ICD-10-CM | POA: Diagnosis present

## 2014-08-01 DIAGNOSIS — G8929 Other chronic pain: Secondary | ICD-10-CM | POA: Diagnosis present

## 2014-08-01 DIAGNOSIS — F419 Anxiety disorder, unspecified: Secondary | ICD-10-CM | POA: Diagnosis present

## 2014-08-01 DIAGNOSIS — R079 Chest pain, unspecified: Secondary | ICD-10-CM | POA: Diagnosis present

## 2014-08-01 DIAGNOSIS — I200 Unstable angina: Secondary | ICD-10-CM | POA: Diagnosis not present

## 2014-08-01 HISTORY — DX: Major depressive disorder, single episode, unspecified: F32.9

## 2014-08-01 HISTORY — DX: Depression, unspecified: F32.A

## 2014-08-01 HISTORY — DX: Other chronic pain: G89.29

## 2014-08-01 HISTORY — DX: Pure hypercholesterolemia, unspecified: E78.00

## 2014-08-01 HISTORY — DX: Migraine, unspecified, not intractable, without status migrainosus: G43.909

## 2014-08-01 HISTORY — DX: Essential (primary) hypertension: I10

## 2014-08-01 HISTORY — DX: Malignant neoplasm of prostate: C61

## 2014-08-01 HISTORY — DX: Anxiety disorder, unspecified: F41.9

## 2014-08-01 HISTORY — DX: Atherosclerotic heart disease of native coronary artery without angina pectoris: I25.10

## 2014-08-01 HISTORY — DX: Low back pain: M54.5

## 2014-08-01 HISTORY — DX: Secondary malignant neoplasm of bone: C79.51

## 2014-08-01 LAB — URINALYSIS, ROUTINE W REFLEX MICROSCOPIC
BILIRUBIN URINE: NEGATIVE
GLUCOSE, UA: 250 mg/dL — AB
HGB URINE DIPSTICK: NEGATIVE
KETONES UR: NEGATIVE mg/dL
Leukocytes, UA: NEGATIVE
Nitrite: NEGATIVE
Protein, ur: NEGATIVE mg/dL
Specific Gravity, Urine: 1.023 (ref 1.005–1.030)
UROBILINOGEN UA: 0.2 mg/dL (ref 0.0–1.0)
pH: 6 (ref 5.0–8.0)

## 2014-08-01 LAB — CBC WITH DIFFERENTIAL/PLATELET
Basophils Absolute: 0 10*3/uL (ref 0.0–0.1)
Basophils Relative: 1 % (ref 0–1)
EOS ABS: 0.4 10*3/uL (ref 0.0–0.7)
Eosinophils Relative: 7 % — ABNORMAL HIGH (ref 0–5)
HEMATOCRIT: 40.6 % (ref 39.0–52.0)
HEMOGLOBIN: 13.6 g/dL (ref 13.0–17.0)
Lymphocytes Relative: 16 % (ref 12–46)
Lymphs Abs: 1 10*3/uL (ref 0.7–4.0)
MCH: 28.8 pg (ref 26.0–34.0)
MCHC: 33.5 g/dL (ref 30.0–36.0)
MCV: 86 fL (ref 78.0–100.0)
MONO ABS: 0.5 10*3/uL (ref 0.1–1.0)
MONOS PCT: 9 % (ref 3–12)
Neutro Abs: 4.2 10*3/uL (ref 1.7–7.7)
Neutrophils Relative %: 67 % (ref 43–77)
Platelets: 216 10*3/uL (ref 150–400)
RBC: 4.72 MIL/uL (ref 4.22–5.81)
RDW: 14.4 % (ref 11.5–15.5)
WBC: 6.1 10*3/uL (ref 4.0–10.5)

## 2014-08-01 LAB — TROPONIN I
TROPONIN I: 0.35 ng/mL — AB (ref <0.30)
Troponin I: <0.3 ng/mL (ref <0.30)
Troponin I: 0.38 ng/mL (ref <0.30)
Troponin I: 0.31 ng/mL (ref <0.30)

## 2014-08-01 LAB — COMPREHENSIVE METABOLIC PANEL
ALT: 16 U/L (ref 0–53)
ANION GAP: 12 (ref 5–15)
AST: 23 U/L (ref 0–37)
Albumin: 3.5 g/dL (ref 3.5–5.2)
Alkaline Phosphatase: 84 U/L (ref 39–117)
BILIRUBIN TOTAL: 0.2 mg/dL — AB (ref 0.3–1.2)
BUN: 21 mg/dL (ref 6–23)
CHLORIDE: 103 meq/L (ref 96–112)
CO2: 23 meq/L (ref 19–32)
Calcium: 8.9 mg/dL (ref 8.4–10.5)
Creatinine, Ser: 0.78 mg/dL (ref 0.50–1.35)
GFR calc Af Amer: >90 mL/min (ref >90)
GFR, EST NON AFRICAN AMERICAN: 84 mL/min — AB (ref >90)
Glucose, Bld: 104 mg/dL — ABNORMAL HIGH (ref 70–99)
POTASSIUM: 4.5 meq/L (ref 3.7–5.3)
Sodium: 138 mEq/L (ref 137–147)
Total Protein: 6.5 g/dL (ref 6.0–8.3)

## 2014-08-01 LAB — LIPASE, BLOOD: Lipase: 42 U/L (ref 11–59)

## 2014-08-01 LAB — I-STAT TROPONIN, ED: Troponin i, poc: 0.13 ng/mL (ref 0.00–0.08)

## 2014-08-01 MED ORDER — TRAMADOL HCL 50 MG PO TABS
100.0000 mg | ORAL_TABLET | Freq: Once | ORAL | Status: AC
  Administered 2014-08-01: 100 mg via ORAL
  Filled 2014-08-01: qty 2

## 2014-08-01 MED ORDER — ENOXAPARIN SODIUM 40 MG/0.4ML ~~LOC~~ SOLN
40.0000 mg | SUBCUTANEOUS | Status: DC
  Administered 2014-08-01: 40 mg via SUBCUTANEOUS
  Filled 2014-08-01 (×2): qty 0.4

## 2014-08-01 MED ORDER — ROSUVASTATIN CALCIUM 10 MG PO TABS: 20.0000 mg | ORAL_TABLET | Freq: Every day | ORAL | Status: DC

## 2014-08-01 MED ORDER — ONDANSETRON HCL 4 MG/2ML IJ SOLN: 4.0000 mg | Freq: Four times a day (QID) | INTRAMUSCULAR | Status: DC | PRN

## 2014-08-01 MED ORDER — CALCIUM CARBONATE-VITAMIN D 500-200 MG-UNIT PO TABS
1.0000 | ORAL_TABLET | Freq: Two times a day (BID) | ORAL | Status: DC
  Administered 2014-08-01 – 2014-08-03 (×4): 1 via ORAL
  Filled 2014-08-01 (×5): qty 1

## 2014-08-01 MED ORDER — GI COCKTAIL ~~LOC~~
30.0000 mL | Freq: Four times a day (QID) | ORAL | Status: DC | PRN
  Administered 2014-08-01: 30 mL via ORAL
  Filled 2014-08-01 (×2): qty 30

## 2014-08-01 MED ORDER — IOHEXOL 350 MG/ML SOLN
80.0000 mL | Freq: Once | INTRAVENOUS | Status: AC | PRN
  Administered 2014-08-01: 80 mL via INTRAVENOUS

## 2014-08-01 MED ORDER — GABAPENTIN 300 MG PO CAPS
600.0000 mg | ORAL_CAPSULE | Freq: Two times a day (BID) | ORAL | Status: DC
  Administered 2014-08-01 – 2014-08-02 (×2): 600 mg via ORAL
  Filled 2014-08-01 (×3): qty 2

## 2014-08-01 MED ORDER — SERTRALINE HCL 50 MG PO TABS
150.0000 mg | ORAL_TABLET | Freq: Every day | ORAL | Status: DC
  Administered 2014-08-02 – 2014-08-03 (×2): 150 mg via ORAL
  Filled 2014-08-01 (×3): qty 1

## 2014-08-01 MED ORDER — PANTOPRAZOLE SODIUM 40 MG PO TBEC
40.0000 mg | DELAYED_RELEASE_TABLET | Freq: Every day | ORAL | Status: DC
  Administered 2014-08-01 – 2014-08-03 (×3): 40 mg via ORAL
  Filled 2014-08-01 (×3): qty 1

## 2014-08-01 MED ORDER — ACETAMINOPHEN 325 MG PO TABS
650.0000 mg | ORAL_TABLET | ORAL | Status: DC | PRN
  Administered 2014-08-02: 650 mg via ORAL
  Filled 2014-08-01: qty 2

## 2014-08-01 MED ORDER — OLOPATADINE HCL 0.1 % OP SOLN
1.0000 [drp] | Freq: Two times a day (BID) | OPHTHALMIC | Status: DC
  Administered 2014-08-01 – 2014-08-03 (×4): 1 [drp] via OPHTHALMIC
  Filled 2014-08-01: qty 5

## 2014-08-01 MED ORDER — HYDROMORPHONE HCL 1 MG/ML IJ SOLN
1.0000 mg | Freq: Once | INTRAMUSCULAR | Status: DC
Start: 2014-08-01 — End: 2014-08-01
  Filled 2014-08-01: qty 1

## 2014-08-01 MED ORDER — TRAMADOL HCL 50 MG PO TABS
100.0000 mg | ORAL_TABLET | Freq: Two times a day (BID) | ORAL | Status: DC
  Administered 2014-08-01 – 2014-08-03 (×4): 100 mg via ORAL
  Filled 2014-08-01 (×4): qty 2

## 2014-08-01 NOTE — Progress Notes (Signed)
Pt troponins trending upward with last troponin at 0.38. Pt c/o  intermittent chest pain, resolved with GI cocktail. On call MD made aware. No new orders given at this time. Will continue to monitor patient.

## 2014-08-01 NOTE — ED Provider Notes (Signed)
CSN: [id number]     Arrival date & time 08/01/14  1027 History   First MD Initiated Contact with Patient 08/01/14 1028     Chief Complaint  Patient presents with  . Chest Pain     (Consider location/radiation/quality/duration/timing/severity/associated sxs/prior Treatment) Patient is a 80 y.o. male presenting with chest pain.  Chest Pain Pain location:  Substernal area Pain quality: pressure   Pain radiates to:  Does not radiate Pain severity:  Moderate Onset quality:  Gradual Duration:  3 days Timing:  Constant Progression:  Worsening Chronicity:  New Context: at rest   Context: not breathing   Relieved by:  Nothing Worsened by:  Nothing tried Ineffective treatments:  None tried Associated symptoms: cough, nausea and vomiting (yesterday)   Associated symptoms: no abdominal pain, no fever and no numbness     Past Medical History  Diagnosis Date  . Heart murmur   . GERD (gastroesophageal reflux disease)   . Vertigo   . High cholesterol     hx (08/01/2014)  . Hypertension     hx (08/01/2014)  . Migraine     "stopped ~ age 15" (08/01/2014)  . Arthritis     "left shoulder" (08/01/2014)  . Chronic lower back pain   . Anxiety   . Depression   . 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 Prostatectomy  2007  . Tonsillectomy    . Pilonidal cyst excision  1960's   History reviewed. No pertinent family history. History  Substance Use Topics  . Smoking status: Never Smoker   . Smokeless tobacco: Never Used  . Alcohol Use: Yes     Comment: 08/01/2014 "stopped drinking in ~ 2007; after rehab"    Review of Systems  Constitutional: Negative for fever.  Respiratory: Positive for cough.   Cardiovascular: Positive for chest pain.  Gastrointestinal: Positive for nausea and vomiting (yesterday). Negative for abdominal pain.  Neurological: Negative for numbness.  All other systems reviewed and  are negative.     Allergies  Codeine  Home Medications   Prior to Admission medications   Medication Sig Start Date End Date Taking? Authorizing Provider  calcium-vitamin D (OSCAL WITH D) 500-200 MG-UNIT per tablet Take 1 tablet by mouth 2 (two) times daily.   Yes Historical Provider, MD  clonazePAM (KLONOPIN) 0.5 MG tablet Take 0.5 mg by mouth 2 (two) times daily as needed.   Yes Historical Provider, MD  denosumab (PROLIA) 60 MG/ML SOLN injection Inject 60 mg into the skin every 6 (six) months. Administer in upper arm, thigh, or abdomen   Yes Historical Provider, MD  diazepam (VALIUM) 5 MG tablet Take 1 tablet (5 mg total) by mouth every 6 (six) hours as needed (dizziness). 04/27/14  Yes Evelina Bucy, MD  fluticasone [facility]) 50 MCG/ACT nasal spray Place 1 spray into the nose 2 (two) times daily. 12/03/12  Yes Billy Fischer, MD  gabapentin (NEURONTIN) 300 MG capsule Take 600 mg by mouth 2 (two) times daily. 05/25/14  Yes Historical Provider, MD  HYDROcodone-acetaminophen (VICODIN) 5-500 MG per tablet Take 1 tablet by mouth every 6 (six) hours as needed.   Yes Historical Provider, MD  leuprolide, 6 Month, (ELIGARD) 45 MG injection Inject 45 mg into the skin every 6 (six) months.   Yes Historical Provider, MD  meclizine (ANTIVERT) 12.5 MG tablet Take 12.5 mg by mouth 3 (three) times daily as needed.   Yes  Historical Provider, MD  naproxen (NAPROSYN) 500 MG tablet Take 500 mg by mouth 2 (two) times daily with a meal.   Yes Historical Provider, MD  olopatadine (PATANOL) 0.1 % ophthalmic solution Place 1 drop into both eyes 2 (two) times daily. 12/03/12  Yes Billy Fischer, MD  omeprazole (PRILOSEC) 20 MG capsule Take 20 mg by mouth daily.   Yes Historical Provider, MD  sertraline (ZOLOFT) 100 MG tablet Take 150 mg by mouth daily.    Yes Historical Provider, MD  traMADol (ULTRAM) 50 MG tablet Take 100 mg by mouth 2 (two) times daily.  07/24/14  Yes Historical Provider, MD  rosuvastatin (CRESTOR) 20  MG tablet Take 20 mg by mouth daily.    Historical Provider, MD   BP 137/80 mmHg  Pulse 63  Temp(Src) 97.8 F (36.6 C) (Oral)  Resp 18  Ht 6\' 2"  (1.88 m)  Wt 177 lb 1.6 oz (80.332 kg)  BMI 22.73 kg/m2  SpO2 97% Physical Exam  Constitutional: He is oriented to person, place, and time. He appears well-developed and well-nourished.  HENT:  Head: Normocephalic and atraumatic.  Eyes: Conjunctivae and EOM are normal.  Neck: Normal range of motion. Neck supple.  Cardiovascular: Normal rate, regular rhythm and normal heart sounds.   Pulmonary/Chest: Effort normal and breath sounds normal. No respiratory distress.  Abdominal: He exhibits no distension. There is no tenderness. There is no rebound and no guarding.  Musculoskeletal: Normal range of motion.  Neurological: He is alert and oriented to person, place, and time.  Skin: Skin is warm and dry.  Vitals reviewed.   ED Course  Procedures (including critical care time) Labs Review Labs Reviewed  COMPREHENSIVE METABOLIC PANEL - Abnormal; Notable for the following:    Glucose, Bld 104 (*)    Total Bilirubin 0.2 (*)    GFR calc non Af Amer 84 (*)    All other components within normal limits  CBC WITH DIFFERENTIAL - Abnormal; Notable for the following:    Eosinophils Relative 7 (*)    All other components within normal limits  URINALYSIS, ROUTINE W REFLEX MICROSCOPIC - Abnormal; Notable for the following:    Glucose, UA 250 (*)    All other components within normal limits  TROPONIN I - Abnormal; Notable for the following:    Troponin I 0.35 (*)    All other components within normal limits  TROPONIN I - Abnormal; Notable for the following:    Troponin I 0.38 (*)    All other components within normal limits  TROPONIN I - Abnormal; Notable for the following:    Troponin I 0.31 (*)    All other components within normal limits  BASIC METABOLIC PANEL - Abnormal; Notable for the following:    Sodium 136 (*)    Glucose, Bld 102 (*)     GFR calc non Af Amer 85 (*)    All other components within normal limits  LIPID PANEL - Abnormal; Notable for the following:    Cholesterol 278 (*)    Triglycerides 270 (*)    HDL 37 (*)    VLDL 54 (*)    LDL Cholesterol 187 (*)    All other components within normal limits  I-STAT TROPOININ, ED - Abnormal; Notable for the following:    Troponin i, poc 0.13 (*)    All other components within normal limits  LIPASE, BLOOD  TROPONIN I  CBC  HEMOGLOBIN A1C    Imaging Review Dg Chest 2 View  08/01/2014   CLINICAL DATA:  Chest pressure.  Prostate cancer  EXAM: CHEST  2 VIEW  COMPARISON:  12/17/2007.  07/31/2010.  FINDINGS: Mediastinum and hilar structures are normal. Multiple sclerotic nodular densities are noted projected over the chest in what appear to be the ribs. Sclerotic lesions are noted in both proximal humeri, right scapula, most likely the spine. Right lower rib fractures are noted. These most likely pathologic. These findings are most consistent with blastic metastatic disease from prostate cancer. These findings are new from prior exam. Given the multitude of pulmonary nodular densities projected over the chest (ribs) underlying pulmonary nodules cannot be excluded. Chest CT may prove useful for further evaluation. Whole-body bone scan can be obtained for further evaluation. Left base pleural parenchymal scarring. Heart size normal. No pleural effusion or pneumothorax.  IMPRESSION: 1. Multiple sclerotic densities noted over the chest. These densities are most likely metastatic lesions to the ribs. Sclerotic lesions are noted in both proximal humeri, right scapula, and most likely in the spine. These findings are also most likely secondary to metastatic disease. Given the multitude of nodular densities over the chest underlying pulmonary nodules cannot be excluded. Chest CT can be obtained if needed. Whole-body bone scan would be useful for further evaluation. 2. Left base pleural  parenchymal scarring.   Electronically Signed   By: Marcello Moores  Register   On: 08/01/2014 13:17   Ct Angio Chest Pe W/cm &/or Wo Cm  08/01/2014   CLINICAL DATA:  Chest pain and shortness of Breath, history of carcinoma of the prostate with bony metastatic disease  EXAM: CT ANGIOGRAPHY CHEST WITH CONTRAST  TECHNIQUE: Multidetector CT imaging of the chest was performed using the standard protocol during bolus administration of intravenous contrast. Multiplanar CT image reconstructions and MIPs were obtained to evaluate the vascular anatomy.  CONTRAST:  65mL OMNIPAQUE IOHEXOL 350 MG/ML SOLN  COMPARISON:  None.  FINDINGS: Lungs are well aerated bilaterally with mild bibasilar atelectatic changes. No sizable parenchymal nodules are seen. The thoracic aorta shows no aneurysmal dilatation. The pulmonary artery shows a normal branching pattern bilaterally. No intraluminal filling defect to suggest pulmonary embolism is identified. No significant hilar or mediastinal adenopathy is seen. Coronary calcifications are noted. No right heart strain is seen. The upper abdomen shows fatty infiltration of the liver. No other focal abnormality is seen. The bony structures show diffuse metastatic disease consistent with the patient's given clinical history.  Review of the MIP images confirms the above findings.  IMPRESSION: No evidence of pulmonary emboli.  Bibasilar atelectatic changes.  Extensive bony metastatic disease consistent with the patient's history.   Electronically Signed   By: Inez Catalina M.D.   On: 08/01/2014 15:30     EKG Interpretation   Date/Time:  Tuesday August 01 2014 10:32:42 EST Ventricular Rate:  66 PR Interval:  228 QRS Duration: 87 QT Interval:  416 QTC Calculation: 436 R Axis:   14 Text Interpretation:  Sinus rhythm Prolonged PR interval Anterior infarct,  old Nonspecific T abnormalities, lateral leads No significant change since  last tracing Confirmed by Debby Freiberg [phone]) on  08/01/2014 10:50:05  AM      MDM   Final diagnoses:  Chest pain    80 y.o. male with pertinent PMH of st 4 prostate ca, GERD presents with chest pain x 3 days, acutely worsening this am with dyspnea.  Pt could not tolerate chemotherapy, is currently on hormones for treatment.  Symptoms resolved after approximately 2 hours. On arrival patient has  vital signs physical exam as above. No dyspnea, very minimal chest pain. EKG without acute change from prior.  Initial trop and CT PE study unremarkable.  Delta troponin returned positive.  Pt pain free at time of return.  Consulted family medicine for admission and ACS ro.   I have reviewed all laboratory and imaging studies if ordered as above  1. Chest pain         Debby Freiberg, MD 08/02/14 [phone]

## 2014-08-01 NOTE — ED Notes (Addendum)
Spoke with admitting physician to make sure he is aware of pt's troponin result.

## 2014-08-01 NOTE — ED Notes (Signed)
Dr. Gentry at bedside. 

## 2014-08-01 NOTE — ED Notes (Signed)
Phlebotomy at the bedside  

## 2014-08-01 NOTE — ED Notes (Signed)
Pt undressed, in gown, on monitor, continuous pulse oximetry and blood pressure cuff; family at bedside 

## 2014-08-01 NOTE — ED Notes (Signed)
Internal Medicine MD at bedside

## 2014-08-01 NOTE — H&P (Signed)
[facility] Admission History and Physical Service Pager: [phone]  Patient name: Frank Petersen Medical record number: [id number] Date of birth: [birth year] Age: 80 y.o. Gender: male  Primary Care Provider: Pcp Not In System Consultants: Cardiology Code Status: FULL (discussed on admission)  Chief Complaint: Chest pain  Assessment and Plan: Frank Petersen is a 80 y.o. male presenting with chest pain . PMH is significant for prostate cancer with metastasis to bones, h/o borderline HTN, acid reflux and anxiety  Chest pain: HPI concerning for ACS but no h/o heart disease.  Heart score of 4. Initial troponin negative.  I-stat 0.13. Repeat lab trop: 0.35.  EKG: with possible old infarct.  CXR: no acute processes, mets visible.  CTA chest with no PE. MI vs reflux vs anxiety     -Admit to telemetry under Dr Nori Riis -Vitals per floor protocol -Cycle troponins -repeat EKG in am -repeat BMET, CBC in am -Zofran PRN Nausea -c/s cardiology  Prostate cancer with bone mets: On hormone therapy q6 months -Continue home Tramadol 100mg  BID for pain, Neurontin 600 BID -Does NOT tolerate hydrocodone/tylenol #3/morphine well: nausea, itching  Acid Reflux: on Omeprazole at home -Protonix while in hospital -GI cocktail now  Anxiety: -Continue home Zoloft  HLD: Patient has not been taking this for the last month -Continue home Crestor  Allergies:  -Continue home Patanol  FEN/GI: Heart healthy diet, NPO after MDN, SLIV Prophylaxis: Lovenox  Disposition: Admit to telemetry for cardiac r/o  History of Present Illness: Frank Petersen is a 80 y.o. male presenting with 3-4 days of chest pain. Patient presents to ED after waking up for the 3rd or 4th day with chest pain that he describes as "a car sitting on his chest".  He was seen by his PCP and was recommended to seek attention in the ED.  He states that chest pain is substernal.  He endorses nausea, vomiting and  Left jaw numbness on Day#2 of CP.  He states that chest pain is worse in the morning and tends to resolved by afternoon.  He reports that CP is worse with activity.  It is not relieved by anything.  He denies CP or nausea currently.  He endorses MSK pain throughout; this is chronic from his metastasis.  Admits to reflux that is controlled with Omeprazole.  Patient states he has a h/o HTN and hyperglycemia for which he was previously treated but has not needed medications in several years.  He does take a statin but has not been taking it for the last month.  He states that there is strong family h/o heart disease.    Review Of Systems: Per HPI with the following additions: none Otherwise 12 point review of systems was performed and was unremarkable.  Patient Active Problem List   Diagnosis Date Noted  . Chest pain 08/01/2014  . Prostate cancer   . Heart murmur   . GERD (gastroesophageal reflux disease)   . Arthritis   . Vertigo    Past Medical History: Past Medical History  Diagnosis Date  . Prostate cancer   . Heart murmur   . Hiatal hernia   . GERD (gastroesophageal reflux disease)   . Arthritis   . Vertigo    Past Surgical History: Past Surgical History  Procedure Laterality Date  . Prostatectomy    . Tonsillectomy    . Cystectomy     Social History: History  Substance Use Topics  . Smoking status: Never Smoker   .  Smokeless tobacco: Not on file  . Alcohol Use: No   Additional social history: non smoker  Please also refer to relevant sections of EMR.  Family History: History reviewed. No pertinent family history. Allergies and Medications: Allergies  Allergen Reactions  . Codeine Nausea Only   No current facility-administered medications on file prior to encounter.   Current Outpatient Prescriptions on File Prior to Encounter  Medication Sig Dispense Refill  . clonazePAM (KLONOPIN) 0.5 MG tablet Take 0.5 mg by mouth 2 (two) times daily as needed.    . diazepam  (VALIUM) 5 MG tablet Take 1 tablet (5 mg total) by mouth every 6 (six) hours as needed (dizziness). 15 tablet 0  . fluticasone (FLONASE) 50 MCG/ACT nasal spray Place 1 spray into the nose 2 (two) times daily. 1 g 2  . HYDROcodone-acetaminophen (VICODIN) 5-500 MG per tablet Take 1 tablet by mouth every 6 (six) hours as needed.    . meclizine (ANTIVERT) 12.5 MG tablet Take 12.5 mg by mouth 3 (three) times daily as needed.    . naproxen (NAPROSYN) 500 MG tablet Take 500 mg by mouth 2 (two) times daily with a meal.    . olopatadine (PATANOL) 0.1 % ophthalmic solution Place 1 drop into both eyes 2 (two) times daily. 5 mL 12  . omeprazole (PRILOSEC) 20 MG capsule Take 20 mg by mouth daily.    . sertraline (ZOLOFT) 100 MG tablet Take 150 mg by mouth daily.     . rosuvastatin (CRESTOR) 20 MG tablet Take 20 mg by mouth daily.      Objective: BP 175/91 mmHg  Pulse 70  Temp(Src) 98.2 F (36.8 C) (Oral)  Resp 17  Ht 6' (1.829 m)  Wt 181 lb (82.101 kg)  BMI 24.54 kg/m2  SpO2 99% Exam: General: awake, alert, sitting up in bed, NAD, wife at bedside HEENT: [place]/AT, EOMI, MMM Cardiovascular: S1S2, regular rhythm but slightly bradycardic, no m/r/g, brisk cap refill Respiratory: CTAB, no wheeze, no increased WOB Abdomen: soft, NT/ND, +BS Extremities: WWP, no cyanosis, clubbing or edema Skin: dry, intact, no rashes Neuro: AAOx3, hard of hearing, no focal deficits otherwise  Labs and Imaging: CBC BMET   Recent Labs Lab 08/01/14 1115  WBC 6.1  HGB 13.6  HCT 40.6  PLT 216    Recent Labs Lab 08/01/14 1115  NA 138  K 4.5  CL 103  CO2 23  BUN 21  CREATININE 0.78  GLUCOSE 104*  CALCIUM 8.9     Dg Chest 2 View  08/01/2014   CLINICAL DATA:  Chest pressure.  Prostate cancer  EXAM: CHEST  2 VIEW  COMPARISON:  12/17/2007.  07/31/2010.  FINDINGS: Mediastinum and hilar structures are normal. Multiple sclerotic nodular densities are noted projected over the chest in what appear to be the ribs.  Sclerotic lesions are noted in both proximal humeri, right scapula, most likely the spine. Right lower rib fractures are noted. These most likely pathologic. These findings are most consistent with blastic metastatic disease from prostate cancer. These findings are new from prior exam. Given the multitude of pulmonary nodular densities projected over the chest (ribs) underlying pulmonary nodules cannot be excluded. Chest CT may prove useful for further evaluation. Whole-body bone scan can be obtained for further evaluation. Left base pleural parenchymal scarring. Heart size normal. No pleural effusion or pneumothorax.  IMPRESSION: 1. Multiple sclerotic densities noted over the chest. These densities are most likely metastatic lesions to the ribs. Sclerotic lesions are noted in both proximal  humeri, right scapula, and most likely in the spine. These findings are also most likely secondary to metastatic disease. Given the multitude of nodular densities over the chest underlying pulmonary nodules cannot be excluded. Chest CT can be obtained if needed. Whole-body bone scan would be useful for further evaluation. 2. Left base pleural parenchymal scarring.   Electronically Signed   By: Marcello Moores  Register   On: 08/01/2014 13:17   Ct Angio Chest Pe W/cm &/or Wo Cm  08/01/2014   CLINICAL DATA:  Chest pain and shortness of Breath, history of carcinoma of the prostate with bony metastatic disease  EXAM: CT ANGIOGRAPHY CHEST WITH CONTRAST  TECHNIQUE: Multidetector CT imaging of the chest was performed using the standard protocol during bolus administration of intravenous contrast. Multiplanar CT image reconstructions and MIPs were obtained to evaluate the vascular anatomy.  CONTRAST:  53mL OMNIPAQUE IOHEXOL 350 MG/ML SOLN  COMPARISON:  None.  FINDINGS: Lungs are well aerated bilaterally with mild bibasilar atelectatic changes. No sizable parenchymal nodules are seen. The thoracic aorta shows no aneurysmal dilatation. The  pulmonary artery shows a normal branching pattern bilaterally. No intraluminal filling defect to suggest pulmonary embolism is identified. No significant hilar or mediastinal adenopathy is seen. Coronary calcifications are noted. No right heart strain is seen. The upper abdomen shows fatty infiltration of the liver. No other focal abnormality is seen. The bony structures show diffuse metastatic disease consistent with the patient's given clinical history.  Review of the MIP images confirms the above findings.  IMPRESSION: No evidence of pulmonary emboli.  Bibasilar atelectatic changes.  Extensive bony metastatic disease consistent with the patient's history.   Electronically Signed   By: Inez Catalina M.D.   On: 08/01/2014 15:30    Janora Norlander, DO 08/01/2014, 5:11 PM PGY-1, [facility] pager: [phone], text pages welcome  I have read and agree with the amended note as above.  Phill Myron, MD, PGY-2 8:33 PM

## 2014-08-01 NOTE — ED Notes (Addendum)
Dreydon Cardenas, pt's wife phone number [phone]. Will notify when patient has an inpatient bed.

## 2014-08-01 NOTE — ED Notes (Signed)
Dr.Gentry at bedside. IV removed by NT for discharge; repeat troponin came back positive. Dr.Gentry speaking to cardiology at this time

## 2014-08-01 NOTE — ED Notes (Signed)
I-stat trop of 0.13 reported to Dr. Betsey Holiday

## 2014-08-01 NOTE — ED Notes (Signed)
[facility]- Pt from Dr. Gabriel Carina. Reported chest pain over the weekend intermittently. Described as pressure. Pt given 324mg  of aspirin in route. No distress noted. Pt has hx of prostate cancer and is treated at the [place].

## 2014-08-02 ENCOUNTER — Other Ambulatory Visit: Payer: Self-pay

## 2014-08-02 ENCOUNTER — Encounter (HOSPITAL_COMMUNITY): Payer: Self-pay | Admitting: Cardiovascular Disease

## 2014-08-02 ENCOUNTER — Encounter (HOSPITAL_COMMUNITY)
Admission: EM | Disposition: A | Discharge status: Home / Self Care | Payer: Self-pay | Source: Home / Self Care | Attending: Family Medicine

## 2014-08-02 DIAGNOSIS — I200 Unstable angina: Secondary | ICD-10-CM

## 2014-08-02 DIAGNOSIS — I214 Non-ST elevation (NSTEMI) myocardial infarction: Secondary | ICD-10-CM | POA: Diagnosis present

## 2014-08-02 DIAGNOSIS — E782 Mixed hyperlipidemia: Secondary | ICD-10-CM

## 2014-08-02 DIAGNOSIS — I25110 Atherosclerotic heart disease of native coronary artery with unstable angina pectoris: Secondary | ICD-10-CM

## 2014-08-02 HISTORY — PX: PERCUTANEOUS CORONARY STENT INTERVENTION (PCI-S): SHX5485

## 2014-08-02 HISTORY — PX: LEFT HEART CATHETERIZATION WITH CORONARY ANGIOGRAM: SHX5451

## 2014-08-02 LAB — POCT ACTIVATED CLOTTING TIME
ACTIVATED CLOTTING TIME: 908 s
Activated Clotting Time: 294 seconds

## 2014-08-02 LAB — BASIC METABOLIC PANEL
Anion gap: 11 (ref 5–15)
BUN: 20 mg/dL (ref 6–23)
CHLORIDE: 98 meq/L (ref 96–112)
CO2: 27 meq/L (ref 19–32)
Calcium: 9.3 mg/dL (ref 8.4–10.5)
Creatinine, Ser: 0.74 mg/dL (ref 0.50–1.35)
GFR calc Af Amer: >90 mL/min (ref >90)
GFR, EST NON AFRICAN AMERICAN: 85 mL/min — AB (ref >90)
GLUCOSE: 102 mg/dL — AB (ref 70–99)
Potassium: 4 mEq/L (ref 3.7–5.3)
SODIUM: 136 meq/L — AB (ref 137–147)

## 2014-08-02 LAB — TROPONIN I
TROPONIN I: 0.31 ng/mL — AB (ref <0.30)
Troponin I: <0.3 ng/mL (ref <0.30)

## 2014-08-02 LAB — CBC
HEMATOCRIT: 40.3 % (ref 39.0–52.0)
HEMOGLOBIN: 13.1 g/dL (ref 13.0–17.0)
MCH: 27.3 pg (ref 26.0–34.0)
MCHC: 32.5 g/dL (ref 30.0–36.0)
MCV: 84.1 fL (ref 78.0–100.0)
Platelets: 230 10*3/uL (ref 150–400)
RBC: 4.79 MIL/uL (ref 4.22–5.81)
RDW: 14.4 % (ref 11.5–15.5)
WBC: 6 10*3/uL (ref 4.0–10.5)

## 2014-08-02 LAB — LIPID PANEL
CHOL/HDL RATIO: 7.5 ratio
Cholesterol: 278 mg/dL — ABNORMAL HIGH (ref 0–200)
HDL: 37 mg/dL — ABNORMAL LOW (ref >39)
LDL Cholesterol: 187 mg/dL — ABNORMAL HIGH (ref 0–99)
TRIGLYCERIDES: 270 mg/dL — AB (ref <150)
VLDL: 54 mg/dL — ABNORMAL HIGH (ref 0–40)

## 2014-08-02 LAB — PROTIME-INR
INR: 0.98 (ref 0.00–1.49)
PROTHROMBIN TIME: 13 s (ref 11.6–15.2)

## 2014-08-02 LAB — HEMOGLOBIN A1C
Hgb A1c MFr Bld: 6.4 % — ABNORMAL HIGH (ref <5.7)
MEAN PLASMA GLUCOSE: 137 mg/dL — AB (ref <117)

## 2014-08-02 SURGERY — LEFT HEART CATHETERIZATION WITH CORONARY ANGIOGRAM
Anesthesia: LOCAL

## 2014-08-02 MED ORDER — MIDAZOLAM HCL 2 MG/2ML IJ SOLN
INTRAMUSCULAR | Status: AC
  Filled 2014-08-02: qty 2

## 2014-08-02 MED ORDER — HEPARIN (PORCINE) IN NACL 2-0.9 UNIT/ML-% IJ SOLN
INTRAMUSCULAR | Status: AC
  Filled 2014-08-02: qty 500

## 2014-08-02 MED ORDER — SODIUM CHLORIDE 0.9 % IJ SOLN
3.0000 mL | Freq: Two times a day (BID) | INTRAMUSCULAR | Status: DC
  Administered 2014-08-02: 3 mL via INTRAVENOUS

## 2014-08-02 MED ORDER — ROSUVASTATIN CALCIUM 20 MG PO TABS
20.0000 mg | ORAL_TABLET | Freq: Every day | ORAL | Status: DC
  Filled 2014-08-02: qty 1

## 2014-08-02 MED ORDER — ASPIRIN 81 MG PO CHEW
81.0000 mg | CHEWABLE_TABLET | Freq: Every day | ORAL | Status: DC
  Filled 2014-08-02: qty 1

## 2014-08-02 MED ORDER — CLOPIDOGREL BISULFATE 75 MG PO TABS
75.0000 mg | ORAL_TABLET | Freq: Every day | ORAL | Status: DC
  Administered 2014-08-03: 75 mg via ORAL
  Filled 2014-08-02: qty 1

## 2014-08-02 MED ORDER — SODIUM CHLORIDE 0.9 % IV SOLN: 250.0000 mL | INTRAVENOUS | Status: DC | PRN

## 2014-08-02 MED ORDER — FENTANYL CITRATE 0.05 MG/ML IJ SOLN
INTRAMUSCULAR | Status: AC
  Filled 2014-08-02: qty 2

## 2014-08-02 MED ORDER — ASPIRIN 81 MG PO CHEW: 81.0000 mg | CHEWABLE_TABLET | ORAL | Status: DC

## 2014-08-02 MED ORDER — NITROGLYCERIN 1 MG/10 ML FOR IR/CATH LAB
INTRA_ARTERIAL | Status: AC
  Filled 2014-08-02: qty 10

## 2014-08-02 MED ORDER — CLOPIDOGREL BISULFATE 300 MG PO TABS
ORAL_TABLET | ORAL | Status: AC
  Filled 2014-08-02: qty 1

## 2014-08-02 MED ORDER — LIDOCAINE HCL (PF) 1 % IJ SOLN
INTRAMUSCULAR | Status: AC
  Filled 2014-08-02: qty 30

## 2014-08-02 MED ORDER — MAGNESIUM HYDROXIDE 400 MG/5ML PO SUSP
30.0000 mL | Freq: Every day | ORAL | Status: DC | PRN
  Filled 2014-08-02: qty 30

## 2014-08-02 MED ORDER — SODIUM CHLORIDE 0.9 % IJ SOLN: 3.0000 mL | INTRAMUSCULAR | Status: DC | PRN

## 2014-08-02 MED ORDER — ASPIRIN 81 MG PO CHEW
81.0000 mg | CHEWABLE_TABLET | Freq: Every day | ORAL | Status: DC
  Administered 2014-08-02 – 2014-08-03 (×2): 81 mg via ORAL
  Filled 2014-08-02 (×2): qty 1

## 2014-08-02 MED ORDER — METOPROLOL TARTRATE 12.5 MG HALF TABLET
12.5000 mg | ORAL_TABLET | Freq: Two times a day (BID) | ORAL | Status: DC
  Administered 2014-08-02 – 2014-08-03 (×2): 12.5 mg via ORAL
  Filled 2014-08-02 (×4): qty 1

## 2014-08-02 MED ORDER — SODIUM CHLORIDE 0.9 % IV SOLN: INTRAVENOUS | Status: AC

## 2014-08-02 MED ORDER — HEPARIN SODIUM (PORCINE) 1000 UNIT/ML IJ SOLN
INTRAMUSCULAR | Status: AC
  Filled 2014-08-02: qty 1

## 2014-08-02 MED ORDER — VERAPAMIL HCL 2.5 MG/ML IV SOLN
INTRAVENOUS | Status: AC
  Filled 2014-08-02: qty 2

## 2014-08-02 MED ORDER — GABAPENTIN 600 MG PO TABS
600.0000 mg | ORAL_TABLET | Freq: Two times a day (BID) | ORAL | Status: DC
  Administered 2014-08-02 – 2014-08-03 (×2): 600 mg via ORAL
  Filled 2014-08-02 (×3): qty 1

## 2014-08-02 MED ORDER — HEPARIN (PORCINE) IN NACL 2-0.9 UNIT/ML-% IJ SOLN
INTRAMUSCULAR | Status: AC
  Filled 2014-08-02: qty 1000

## 2014-08-02 NOTE — H&P (View-Only) (Signed)
Reason for Consult: Unstable angina  Requesting Physician: Nori Riis  Cardiologist: None in [place]  HPI: This is a 80 y.o. male with a past medical history significant for widely metastatic prostate cancer (to bone) with recent excellent response to androgen suppression therapy presents with three days of severe chest pressure radiating to jaw, worsened by activity, associated with nausea and vomiting and dyspnea and has mild ECG abnormalities and borderline elevation in cardiac troponin level.  Now pain free.  CT angio chest negative for PE, coronary calcification is widespread, but relatively mild. ECG with stable T wave inversion aVL (no older tracings).  PMHx:  Past Medical History  Diagnosis Date  . Heart murmur   . GERD (gastroesophageal reflux disease)   . Vertigo   . High cholesterol     hx (08/01/2014)  . Hypertension     hx (08/01/2014)  . Migraine     "stopped ~ age 69" (08/01/2014)  . Arthritis     "left shoulder" (08/01/2014)  . Chronic lower back pain   . Anxiety   . Depression   . 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 Prostatectomy  2007  . Tonsillectomy    . Pilonidal cyst excision  1960's    FAMHx: History reviewed. No pertinent family history.  SOCHx:  reports that he has never smoked. He has never used smokeless tobacco. He reports that he drinks alcohol. He reports that he does not use illicit drugs.  ALLERGIES: Allergies  Allergen Reactions  . Codeine Nausea Only    ROS: Constitutional: positive for fatigue Eyes: negative Ears, nose, mouth, throat, and face: positive for hearing loss Respiratory: positive for transeint dyspnea Cardiovascular: positive for chest pressure/discomfort and exertional chest pressure/discomfort, negative for lower extremity edema, palpitations and syncope Gastrointestinal: positive for nausea and  vomiting Genitourinary:positive for decreased stream and frequency Integument/breast: negative Hematologic/lymphatic: negative for bleeding, easy bruising and lymphadenopathy Musculoskeletal:positive for bone pain Neurological: negative Behavioral/Psych: negative Endocrine: negative Allergic/Immunologic: negative  HOME MEDICATIONS: Prescriptions prior to admission  Medication Sig Dispense Refill Last Dose  . calcium-vitamin D (OSCAL WITH D) 500-200 MG-UNIT per tablet Take 1 tablet by mouth 2 (two) times daily.   07/31/2014 at Unknown time  . clonazePAM (KLONOPIN) 0.5 MG tablet Take 0.5 mg by mouth 2 (two) times daily as needed.   Past Month at Unknown time  . denosumab (PROLIA) 60 MG/ML SOLN injection Inject 60 mg into the skin every 6 (six) months. Administer in upper arm, thigh, or abdomen   06-2014  . diazepam (VALIUM) 5 MG tablet Take 1 tablet (5 mg total) by mouth every 6 (six) hours as needed (dizziness). 15 tablet 0 Past Month at Unknown time  . fluticasone (FLONASE) 50 MCG/ACT nasal spray Place 1 spray into the nose 2 (two) times daily. 1 g 2 07/31/2014 at Unknown time  . gabapentin (NEURONTIN) 300 MG capsule Take 600 mg by mouth 2 (two) times daily.   08/01/2014 at Unknown time  . HYDROcodone-acetaminophen (VICODIN) 5-500 MG per tablet Take 1 tablet by mouth every 6 (six) hours as needed.   Past Month at Unknown time  . leuprolide, 6 Month, (ELIGARD) 45 MG injection Inject 45 mg into the skin every 6 (six) months.   [id number]  . meclizine (ANTIVERT) 12.5 MG tablet Take 12.5 mg by mouth 3 (three) times daily as needed.   Past Month at Unknown time  .  naproxen (NAPROSYN) 500 MG tablet Take 500 mg by mouth 2 (two) times daily with a meal.   Past Month at Unknown time  . olopatadine (PATANOL) 0.1 % ophthalmic solution Place 1 drop into both eyes 2 (two) times daily. 5 mL 12 07/31/2014 at Unknown time  . omeprazole (PRILOSEC) 20 MG capsule Take 20 mg by mouth daily.   07/31/2014 at Unknown  time  . sertraline (ZOLOFT) 100 MG tablet Take 150 mg by mouth daily.    08/01/2014 at Unknown time  . traMADol (ULTRAM) 50 MG tablet Take 100 mg by mouth 2 (two) times daily.    08/01/2014 at Unknown time  . rosuvastatin (CRESTOR) 20 MG tablet Take 20 mg by mouth daily.   Not Taking at Unknown time    HOSPITAL MEDICATIONS: I have reviewed the patient's current medications. Prior to Admission:  Prescriptions prior to admission  Medication Sig Dispense Refill Last Dose  . calcium-vitamin D (OSCAL WITH D) 500-200 MG-UNIT per tablet Take 1 tablet by mouth 2 (two) times daily.   07/31/2014 at Unknown time  . clonazePAM (KLONOPIN) 0.5 MG tablet Take 0.5 mg by mouth 2 (two) times daily as needed.   Past Month at Unknown time  . denosumab (PROLIA) 60 MG/ML SOLN injection Inject 60 mg into the skin every 6 (six) months. Administer in upper arm, thigh, or abdomen   06-2014  . diazepam (VALIUM) 5 MG tablet Take 1 tablet (5 mg total) by mouth every 6 (six) hours as needed (dizziness). 15 tablet 0 Past Month at Unknown time  . fluticasone (FLONASE) 50 MCG/ACT nasal spray Place 1 spray into the nose 2 (two) times daily. 1 g 2 07/31/2014 at Unknown time  . gabapentin (NEURONTIN) 300 MG capsule Take 600 mg by mouth 2 (two) times daily.   08/01/2014 at Unknown time  . HYDROcodone-acetaminophen (VICODIN) 5-500 MG per tablet Take 1 tablet by mouth every 6 (six) hours as needed.   Past Month at Unknown time  . leuprolide, 6 Month, (ELIGARD) 45 MG injection Inject 45 mg into the skin every 6 (six) months.   [id number]  . meclizine (ANTIVERT) 12.5 MG tablet Take 12.5 mg by mouth 3 (three) times daily as needed.   Past Month at Unknown time  . naproxen (NAPROSYN) 500 MG tablet Take 500 mg by mouth 2 (two) times daily with a meal.   Past Month at Unknown time  . olopatadine (PATANOL) 0.1 % ophthalmic solution Place 1 drop into both eyes 2 (two) times daily. 5 mL 12 07/31/2014 at Unknown time  . omeprazole (PRILOSEC) 20  MG capsule Take 20 mg by mouth daily.   07/31/2014 at Unknown time  . sertraline (ZOLOFT) 100 MG tablet Take 150 mg by mouth daily.    08/01/2014 at Unknown time  . traMADol (ULTRAM) 50 MG tablet Take 100 mg by mouth 2 (two) times daily.    08/01/2014 at Unknown time  . rosuvastatin (CRESTOR) 20 MG tablet Take 20 mg by mouth daily.   Not Taking at Unknown time   Scheduled: . aspirin  81 mg Oral Daily  . calcium-vitamin D  1 tablet Oral BID  . enoxaparin (LOVENOX) injection  40 mg Subcutaneous Q24H  . gabapentin  600 mg Oral BID  . olopatadine  1 drop Both Eyes BID  . pantoprazole  40 mg Oral Daily  . sertraline  150 mg Oral Daily  . traMADol  100 mg Oral BID   Continuous:   VITALS: Blood pressure  152/91, pulse 61, temperature 98 F (36.7 C), temperature source Oral, resp. rate 18, height 6\' 2"  (1.88 m), weight 177 lb 1.6 oz (80.332 kg), SpO2 98 %.  PHYSICAL EXAM:  General: Alert, oriented x3, no distress Head: no evidence of trauma, PERRL, EOMI, no exophtalmos or lid lag, no myxedema, no xanthelasma; normal ears, nose and oropharynx Neck: normal jugular venous pulsations and no hepatojugular reflux; brisk carotid pulses without delay and no carotid bruits Chest: clear to auscultation, no signs of consolidation by percussion or palpation, normal fremitus, symmetrical and full respiratory excursions Cardiovascular: normal position and quality of the apical impulse, regular rhythm, normal first heart sound and normal second heart sound, no rubs or gallops, very faint early systolic murmur Abdomen: no tenderness or distention, no masses by palpation, no abnormal pulsatility or arterial bruits, normal bowel sounds, no hepatosplenomegaly Extremities: no clubbing, cyanosis;  no edema; 2+ radial, ulnar and brachial pulses bilaterally; 2+ right femoral, posterior tibial and dorsalis pedis pulses; 2+ left femoral, posterior tibial and dorsalis pedis pulses; no subclavian or femoral  bruits Neurological: grossly nonfocal   LABS  CBC  Recent Labs  08/01/14 1115 08/02/14 0245  WBC 6.1 6.0  NEUTROABS 4.2  --   HGB 13.6 13.1  HCT 40.6 40.3  MCV 86.0 84.1  PLT 216 782   Basic Metabolic Panel  Recent Labs  08/01/14 1115 08/02/14 0245  NA 138 136*  K 4.5 4.0  CL 103 98  CO2 23 27  GLUCOSE 104* 102*  BUN 21 20  CREATININE 0.78 0.74  CALCIUM 8.9 9.3   Liver Function Tests  Recent Labs  08/01/14 1115  AST 23  ALT 16  ALKPHOS 84  BILITOT 0.2*  PROT 6.5  ALBUMIN 3.5    Recent Labs  08/01/14 1115  LIPASE 42   Cardiac Enzymes  Recent Labs  08/01/14 1612 08/01/14 1903 08/01/14 2107  TROPONINI 0.35* 0.38* 0.31*   BNP Invalid input(s): POCBNP D-Dimer No results for input(s): DDIMER in the last 72 hours. Hemoglobin A1C  Recent Labs  08/02/14 0245  HGBA1C 6.4*   Fasting Lipid Panel  Recent Labs  08/02/14 0245  CHOL 278*  HDL 37*  LDLCALC 187*  TRIG 270*  CHOLHDL 7.5   Thyroid Function Tests No results for input(s): TSH, T4TOTAL, T3FREE, THYROIDAB in the last 72 hours.  Invalid input(s): FREET3    IMAGING: Dg Chest 2 View  08/01/2014   CLINICAL DATA:  Chest pressure.  Prostate cancer  EXAM: CHEST  2 VIEW  COMPARISON:  12/17/2007.  07/31/2010.  FINDINGS: Mediastinum and hilar structures are normal. Multiple sclerotic nodular densities are noted projected over the chest in what appear to be the ribs. Sclerotic lesions are noted in both proximal humeri, right scapula, most likely the spine. Right lower rib fractures are noted. These most likely pathologic. These findings are most consistent with blastic metastatic disease from prostate cancer. These findings are new from prior exam. Given the multitude of pulmonary nodular densities projected over the chest (ribs) underlying pulmonary nodules cannot be excluded. Chest CT may prove useful for further evaluation. Whole-body bone scan can be obtained for further evaluation. Left  base pleural parenchymal scarring. Heart size normal. No pleural effusion or pneumothorax.  IMPRESSION: 1. Multiple sclerotic densities noted over the chest. These densities are most likely metastatic lesions to the ribs. Sclerotic lesions are noted in both proximal humeri, right scapula, and most likely in the spine. These findings are also most likely secondary to metastatic  disease. Given the multitude of nodular densities over the chest underlying pulmonary nodules cannot be excluded. Chest CT can be obtained if needed. Whole-body bone scan would be useful for further evaluation. 2. Left base pleural parenchymal scarring.   Electronically Signed   By: Marcello Moores  Register   On: 08/01/2014 13:17   Ct Angio Chest Pe W/cm &/or Wo Cm  08/01/2014   CLINICAL DATA:  Chest pain and shortness of Breath, history of carcinoma of the prostate with bony metastatic disease  EXAM: CT ANGIOGRAPHY CHEST WITH CONTRAST  TECHNIQUE: Multidetector CT imaging of the chest was performed using the standard protocol during bolus administration of intravenous contrast. Multiplanar CT image reconstructions and MIPs were obtained to evaluate the vascular anatomy.  CONTRAST:  67mL OMNIPAQUE IOHEXOL 350 MG/ML SOLN  COMPARISON:  None.  FINDINGS: Lungs are well aerated bilaterally with mild bibasilar atelectatic changes. No sizable parenchymal nodules are seen. The thoracic aorta shows no aneurysmal dilatation. The pulmonary artery shows a normal branching pattern bilaterally. No intraluminal filling defect to suggest pulmonary embolism is identified. No significant hilar or mediastinal adenopathy is seen. Coronary calcifications are noted. No right heart strain is seen. The upper abdomen shows fatty infiltration of the liver. No other focal abnormality is seen. The bony structures show diffuse metastatic disease consistent with the patient's given clinical history.  Review of the MIP images confirms the above findings.  IMPRESSION: No evidence  of pulmonary emboli.  Bibasilar atelectatic changes.  Extensive bony metastatic disease consistent with the patient's history.   Electronically Signed   By: Inez Catalina M.D.   On: 08/01/2014 15:30    ECG: NSR, lateral T wave inversion  TELEMETRY: NSr  IMPRESSION: 1. Unstable angina with high risk biomarkers and abnormal ECG 2. Widely metastatic prostate cancer, currently stable and with good response to antiandrogen therapy, good functional status 3. Hypercholesterolemia  RECOMMENDATION: 1. Urgent cardiac catheterization. If anatomy is amenable to PCI, this is reasonable. Prostate cancer limits prognosis and CABG is probably not reasonable. This procedure has been fully reviewed with the patient and written informed consent has been obtained. 2. Low dose beta blocker (relative bradycardia) and statin (if he has been taking Crestor 20 mg daily consistently, need to add Zetia).   Time Spent Directly with Patient: 60 minutes  Sanda Klein, MD, [facility] [phone] office [phone] pager   08/02/2014, 11:00 AM

## 2014-08-02 NOTE — Progress Notes (Signed)
Notified phlebotomy for the second time r/t STAT PT/INR that still has not been drawn. Phlebotomy tech to come STAT.

## 2014-08-02 NOTE — Progress Notes (Signed)
UR completed 

## 2014-08-02 NOTE — CV Procedure (Signed)
Cardiac Catheterization Operative Report  HAYWARD RYLANDER [id number] 12/16/20154:41 PM Pcp Not In System  Procedure Performed:  1. Left Heart Catheterization 2. Selective Coronary Angiography 3. Left ventricular angiogram 4. PTCA/DES x 1 OM2  Operator: Lauree Chandler, MD  Arterial access site:  Right radial artery.   Indication:  80 yo male with history of HTN, HLD, metastatic prostate cancer admitted with unstable angina. Troponin is elevated at 0.3.                                     Procedure Details: The risks, benefits, complications, treatment options, and expected outcomes were discussed with the patient. The patient and/or family concurred with the proposed plan, giving informed consent. The patient was brought to the cath lab after IV hydration was begun and oral premedication was given. The patient was further sedated with Versed and Fentanyl. The right wrist was assessed with a modified Allens test which was positive. The right wrist was prepped and draped in a sterile fashion. 1% lidocaine was used for local anesthesia. Using the modified Seldinger access technique, a 5 French sheath was placed in the right radial artery. 3 mg Verapamil was given through the sheath. 4000 units IV heparin was given. Standard diagnostic catheters were used to perform selective coronary angiography. A pigtail catheter was used to perform a left ventricular angiogram. The culprit vessel was felt to be the severe stenosis in the mid bifurcation of the moderate caliber OM2. I elected to proceed to PCI of OM2.   PCI Note: The left main was engaged with a XB LAD 3.5 guiding catheter. He was given an additional 6000 units IV heparin. He was given Plavix 600 mg po x 1. I advanced a Cougar IC wire into the Circumflex and down the AV groove. I then directed a Whisper wire down OM2 but could not cross the severe stenosis in OM2. I then used an 1.5 x 6 mm mini Trek OTW balloon to gain support in  the vessel and cross the lesion with a Fielder XT wire. I was able inflate this balloon x 3. I then used a 2.0 x 12 mm mini Trek balloon and inflated x 2. I was then able to deliver a 2.5 x 15 mm Xience Alpine DES into the OM2. I then delivered a 2.25 x 8 mm Xience Alpine DES into the more distal segment and deployed overlapping with the more proximal stent. There was an excellent result. It was long, difficult case due to vessel tortuosity and severity of the stenosis. The stenosis was taken from 99% down to 0%.    The sheath was removed from the right radial artery and a Terumo hemostasis band was applied at the arteriotomy site on the right wrist. There were no immediate complications. The patient was taken to the recovery area in stable condition.   Hemodynamic Findings: Central aortic pressure: 139/67 Left ventricular pressure: 147/7/17  Angiographic Findings:  Left main: No obstructive disease.   Left Anterior Descending Artery: Large caliber vessel that courses to the apex. 40% mid stenosis. Small caliber diagonal branch with 99% stenosis.   Circumflex Artery: Large caliber vessel with small caliber first obtuse marginal branch, large caliber second obtuse marginal branch. The first OM branch has ostial 50% stenosis (small caliber vessel). The second OM branch has a complex long segment of 99% stenosis just before the bifurcation.  Right Coronary Artery: Large dominant vessel with 20% stenosis in the proximal and mid vessel.   Left Ventricular Angiogram: LVEF=55-60%.   Impression: 1. NSTEMI secondary to severe stenosis OM2 2. Single vessel CAD 3. Preserved LV systolic function 4. Successful PTCA/DES x 2 OM2  Recommendations: He will need dual antiplatelet therapy with ASA and Plavix for at least one year. Continue statin and beta blocker.        Complications:  None. The patient tolerated the procedure well.

## 2014-08-02 NOTE — CV Procedure (Signed)
Entered in error. See cath note dated 08/02/14.

## 2014-08-02 NOTE — Progress Notes (Signed)
Patient noted to have 1st degree AV block with septal infart age undetermined, on call MD made aware no new orders given. Will continue to monitor patient.

## 2014-08-02 NOTE — Progress Notes (Signed)
Family Medicine Teaching Service Daily Progress Note Intern Pager: [phone]  Patient name: Frank Petersen Medical record number: [id number] Date of birth: 02/08/1933 Age: 80 y.o. Gender: male  Primary Care Provider: Pcp Not In System Consultants: Cardiology Code Status: Full (discussed on admission)  Pt Overview and Major Events to Date:  12/15: Troponins trending down  Assessment and Plan: Frank Petersen is a 80 y.o. male presenting with chest pain . PMH is significant for prostate cancer with metastasis to bones, h/o borderline HTN, acid reflux and anxiety  Chest pain: HPI concerning for ACS but no h/o heart disease. Heart score of 4. Initial troponin negative. I-stat 0.13. Repeat lab trop: 0.35. EKG: with possible old infarct. CXR: no acute processes, mets visible. CTA chest with no PE. MI vs reflux vs anxiety.EKG 12/16 unchanged. Troponins trending down. CBC unremarkable. Na 136. LDL 187, HDL 37.  EKG repeated this am with PACs -Vitals per floor protocol -Cycle troponins -F/u A1c -Zofran PRN Nausea -c/s cardiology: to see this am -NPO  Prostate cancer with bone mets: On hormone therapy q6 months -Continue home Tramadol 100mg  BID for pain, Neurontin 600 BID -Does NOT tolerate hydrocodone/tylenol #3/morphine well: nausea, itching  Acid Reflux: on Omeprazole at home -Protonix while in hospital -GI cocktail now  Anxiety: -Continue home Zoloft  HLD: Patient has not been taking this for the last month -Continue home Crestor  Allergies:  -Continue home Patanol  FEN/GI: Heart healthy diet, NPO after MDN, SLIV Prophylaxis: Lovenox  Disposition: Discharge pending cardiac rule out.  Subjective:  Patient reports that he is feeling much better today.  He denies any chest pain, SOB, N/V, dizziness, sweating, headache.  He states that he is ready to go home.  I informed him that cardiology would be by this morning to see him and we would see what they had to say  first.  He is amenable to this.  Objective: Temp:  [97.5 F (36.4 C)-98.8 F (37.1 C)] 97.8 F (36.6 C) (12/16 0458) Pulse Rate:  [57-79] 63 (12/16 0458) Resp:  [14-21] 18 (12/16 0458) BP: (115-185)/(52-92) 137/80 mmHg (12/16 0458) SpO2:  [96 %-100 %] 97 % (12/16 0458) Weight:  [177 lb 1.6 oz (80.332 kg)-181 lb (82.101 kg)] 177 lb 1.6 oz (80.332 kg) (12/16 0458) Physical Exam: General: awake, alert, lying up in bed, NAD, RN at bedside HEENT: [place]/AT, EOMI, MMM Cardiovascular: S1S2, regular rhythm but slightly bradycardic, no m/r/g, brisk cap refill Respiratory: CTAB anteriorly, no wheeze, no increased WOB Abdomen: soft, NT/ND, +BS Extremities: WWP, no cyanosis, clubbing or edema Skin: dry, intact, no rashes Neuro: AAOx3, hard of hearing, no focal deficits otherwise  Laboratory:  Recent Labs Lab 08/01/14 1115 08/02/14 0245  WBC 6.1 6.0  HGB 13.6 13.1  HCT 40.6 40.3  PLT 216 230    Recent Labs Lab 08/01/14 1115 08/02/14 0245  NA 138 136*  K 4.5 4.0  CL 103 98  CO2 23 27  BUN 21 20  CREATININE 0.78 0.74  CALCIUM 8.9 9.3  PROT 6.5  --   BILITOT 0.2*  --   ALKPHOS 84  --   ALT 16  --   AST 23  --   GLUCOSE 104* 102*   Troponin: negative>>0.35>>0.38>>0.31  Imaging/Diagnostic Tests: Dg Chest 2 View  08/01/2014   CLINICAL DATA:  Chest pressure.  Prostate cancer  EXAM: CHEST  2 VIEW  COMPARISON:  12/17/2007.  07/31/2010.  FINDINGS: Mediastinum and hilar structures are normal. Multiple sclerotic nodular densities are noted  projected over the chest in what appear to be the ribs. Sclerotic lesions are noted in both proximal humeri, right scapula, most likely the spine. Right lower rib fractures are noted. These most likely pathologic. These findings are most consistent with blastic metastatic disease from prostate cancer. These findings are new from prior exam. Given the multitude of pulmonary nodular densities projected over the chest (ribs) underlying pulmonary nodules  cannot be excluded. Chest CT may prove useful for further evaluation. Whole-body bone scan can be obtained for further evaluation. Left base pleural parenchymal scarring. Heart size normal. No pleural effusion or pneumothorax.  IMPRESSION: 1. Multiple sclerotic densities noted over the chest. These densities are most likely metastatic lesions to the ribs. Sclerotic lesions are noted in both proximal humeri, right scapula, and most likely in the spine. These findings are also most likely secondary to metastatic disease. Given the multitude of nodular densities over the chest underlying pulmonary nodules cannot be excluded. Chest CT can be obtained if needed. Whole-body bone scan would be useful for further evaluation. 2. Left base pleural parenchymal scarring.   Electronically Signed   By: Marcello Moores  Register   On: 08/01/2014 13:17   Ct Angio Chest Pe W/cm &/or Wo Cm  08/01/2014   CLINICAL DATA:  Chest pain and shortness of Breath, history of carcinoma of the prostate with bony metastatic disease  EXAM: CT ANGIOGRAPHY CHEST WITH CONTRAST  TECHNIQUE: Multidetector CT imaging of the chest was performed using the standard protocol during bolus administration of intravenous contrast. Multiplanar CT image reconstructions and MIPs were obtained to evaluate the vascular anatomy.  CONTRAST:  14mL OMNIPAQUE IOHEXOL 350 MG/ML SOLN  COMPARISON:  None.  FINDINGS: Lungs are well aerated bilaterally with mild bibasilar atelectatic changes. No sizable parenchymal nodules are seen. The thoracic aorta shows no aneurysmal dilatation. The pulmonary artery shows a normal branching pattern bilaterally. No intraluminal filling defect to suggest pulmonary embolism is identified. No significant hilar or mediastinal adenopathy is seen. Coronary calcifications are noted. No right heart strain is seen. The upper abdomen shows fatty infiltration of the liver. No other focal abnormality is seen. The bony structures show diffuse metastatic  disease consistent with the patient's given clinical history.  Review of the MIP images confirms the above findings.  IMPRESSION: No evidence of pulmonary emboli.  Bibasilar atelectatic changes.  Extensive bony metastatic disease consistent with the patient's history.   Electronically Signed   By: Inez Catalina M.D.   On: 08/01/2014 15:30     Janora Norlander, DO 08/02/2014, 8:10 AM PGY-1, [place] Intern pager: [phone], text pages welcome

## 2014-08-02 NOTE — Interval H&P Note (Signed)
History and Physical Interval Note:  08/02/2014 4:34 PM  Frank Petersen  has presented today for cardiac cath with the diagnosis of NSTEMI/unstable angina. The various methods of treatment have been discussed with the patient and family. After consideration of risks, benefits and other options for treatment, the patient has consented to  Procedure(s): LEFT HEART CATHETERIZATION WITH CORONARY ANGIOGRAM (N/A) as a surgical intervention .  The patient's history has been reviewed, patient examined, no change in status, stable for surgery.  I have reviewed the patient's chart and labs.  Questions were answered to the patient's satisfaction.    Cath Lab Visit (complete for each Cath Lab visit)  Clinical Evaluation Leading to the Procedure:   ACS: Yes.    Non-ACS:    Anginal Classification: CCS IV  Anti-ischemic medical therapy: Minimal Therapy (1 class of medications)  Non-Invasive Test Results: No non-invasive testing performed  Prior CABG: No previous CABG    Frank Petersen

## 2014-08-02 NOTE — Consult Note (Signed)
Reason for Consult: Unstable angina  Requesting Physician: Nori Riis  Cardiologist: None in [place]  HPI: This is a 80 y.o. male with a past medical history significant for widely metastatic prostate cancer (to bone) with recent excellent response to androgen suppression therapy presents with three days of severe chest pressure radiating to jaw, worsened by activity, associated with nausea and vomiting and dyspnea and has mild ECG abnormalities and borderline elevation in cardiac troponin level.  Now pain free.  CT angio chest negative for PE, coronary calcification is widespread, but relatively mild. ECG with stable T wave inversion aVL (no older tracings).  PMHx:  Past Medical History  Diagnosis Date  . Heart murmur   . GERD (gastroesophageal reflux disease)   . Vertigo   . High cholesterol     hx (08/01/2014)  . Hypertension     hx (08/01/2014)  . Migraine     "stopped ~ age 24" (08/01/2014)  . Arthritis     "left shoulder" (08/01/2014)  . Chronic lower back pain   . Anxiety   . Depression   . 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 Prostatectomy  2007  . Tonsillectomy    . Pilonidal cyst excision  1960's    FAMHx: History reviewed. No pertinent family history.  SOCHx:  reports that he has never smoked. He has never used smokeless tobacco. He reports that he drinks alcohol. He reports that he does not use illicit drugs.  ALLERGIES: Allergies  Allergen Reactions  . Codeine Nausea Only    ROS: Constitutional: positive for fatigue Eyes: negative Ears, nose, mouth, throat, and face: positive for hearing loss Respiratory: positive for transeint dyspnea Cardiovascular: positive for chest pressure/discomfort and exertional chest pressure/discomfort, negative for lower extremity edema, palpitations and syncope Gastrointestinal: positive for nausea and  vomiting Genitourinary:positive for decreased stream and frequency Integument/breast: negative Hematologic/lymphatic: negative for bleeding, easy bruising and lymphadenopathy Musculoskeletal:positive for bone pain Neurological: negative Behavioral/Psych: negative Endocrine: negative Allergic/Immunologic: negative  HOME MEDICATIONS: Prescriptions prior to admission  Medication Sig Dispense Refill Last Dose  . calcium-vitamin D (OSCAL WITH D) 500-200 MG-UNIT per tablet Take 1 tablet by mouth 2 (two) times daily.   07/31/2014 at Unknown time  . clonazePAM (KLONOPIN) 0.5 MG tablet Take 0.5 mg by mouth 2 (two) times daily as needed.   Past Month at Unknown time  . denosumab (PROLIA) 60 MG/ML SOLN injection Inject 60 mg into the skin every 6 (six) months. Administer in upper arm, thigh, or abdomen   06-2014  . diazepam (VALIUM) 5 MG tablet Take 1 tablet (5 mg total) by mouth every 6 (six) hours as needed (dizziness). 15 tablet 0 Past Month at Unknown time  . fluticasone (FLONASE) 50 MCG/ACT nasal spray Place 1 spray into the nose 2 (two) times daily. 1 g 2 07/31/2014 at Unknown time  . gabapentin (NEURONTIN) 300 MG capsule Take 600 mg by mouth 2 (two) times daily.   08/01/2014 at Unknown time  . HYDROcodone-acetaminophen (VICODIN) 5-500 MG per tablet Take 1 tablet by mouth every 6 (six) hours as needed.   Past Month at Unknown time  . leuprolide, 6 Month, (ELIGARD) 45 MG injection Inject 45 mg into the skin every 6 (six) months.   [id number]  . meclizine (ANTIVERT) 12.5 MG tablet Take 12.5 mg by mouth 3 (three) times daily as needed.   Past Month at Unknown time  .  naproxen (NAPROSYN) 500 MG tablet Take 500 mg by mouth 2 (two) times daily with a meal.   Past Month at Unknown time  . olopatadine (PATANOL) 0.1 % ophthalmic solution Place 1 drop into both eyes 2 (two) times daily. 5 mL 12 07/31/2014 at Unknown time  . omeprazole (PRILOSEC) 20 MG capsule Take 20 mg by mouth daily.   07/31/2014 at Unknown  time  . sertraline (ZOLOFT) 100 MG tablet Take 150 mg by mouth daily.    08/01/2014 at Unknown time  . traMADol (ULTRAM) 50 MG tablet Take 100 mg by mouth 2 (two) times daily.    08/01/2014 at Unknown time  . rosuvastatin (CRESTOR) 20 MG tablet Take 20 mg by mouth daily.   Not Taking at Unknown time    HOSPITAL MEDICATIONS: I have reviewed the patient's current medications. Prior to Admission:  Prescriptions prior to admission  Medication Sig Dispense Refill Last Dose  . calcium-vitamin D (OSCAL WITH D) 500-200 MG-UNIT per tablet Take 1 tablet by mouth 2 (two) times daily.   07/31/2014 at Unknown time  . clonazePAM (KLONOPIN) 0.5 MG tablet Take 0.5 mg by mouth 2 (two) times daily as needed.   Past Month at Unknown time  . denosumab (PROLIA) 60 MG/ML SOLN injection Inject 60 mg into the skin every 6 (six) months. Administer in upper arm, thigh, or abdomen   06-2014  . diazepam (VALIUM) 5 MG tablet Take 1 tablet (5 mg total) by mouth every 6 (six) hours as needed (dizziness). 15 tablet 0 Past Month at Unknown time  . fluticasone (FLONASE) 50 MCG/ACT nasal spray Place 1 spray into the nose 2 (two) times daily. 1 g 2 07/31/2014 at Unknown time  . gabapentin (NEURONTIN) 300 MG capsule Take 600 mg by mouth 2 (two) times daily.   08/01/2014 at Unknown time  . HYDROcodone-acetaminophen (VICODIN) 5-500 MG per tablet Take 1 tablet by mouth every 6 (six) hours as needed.   Past Month at Unknown time  . leuprolide, 6 Month, (ELIGARD) 45 MG injection Inject 45 mg into the skin every 6 (six) months.   [id number]  . meclizine (ANTIVERT) 12.5 MG tablet Take 12.5 mg by mouth 3 (three) times daily as needed.   Past Month at Unknown time  . naproxen (NAPROSYN) 500 MG tablet Take 500 mg by mouth 2 (two) times daily with a meal.   Past Month at Unknown time  . olopatadine (PATANOL) 0.1 % ophthalmic solution Place 1 drop into both eyes 2 (two) times daily. 5 mL 12 07/31/2014 at Unknown time  . omeprazole (PRILOSEC) 20  MG capsule Take 20 mg by mouth daily.   07/31/2014 at Unknown time  . sertraline (ZOLOFT) 100 MG tablet Take 150 mg by mouth daily.    08/01/2014 at Unknown time  . traMADol (ULTRAM) 50 MG tablet Take 100 mg by mouth 2 (two) times daily.    08/01/2014 at Unknown time  . rosuvastatin (CRESTOR) 20 MG tablet Take 20 mg by mouth daily.   Not Taking at Unknown time   Scheduled: . aspirin  81 mg Oral Daily  . calcium-vitamin D  1 tablet Oral BID  . enoxaparin (LOVENOX) injection  40 mg Subcutaneous Q24H  . gabapentin  600 mg Oral BID  . olopatadine  1 drop Both Eyes BID  . pantoprazole  40 mg Oral Daily  . sertraline  150 mg Oral Daily  . traMADol  100 mg Oral BID   Continuous:   VITALS: Blood pressure  152/91, pulse 61, temperature 98 F (36.7 C), temperature source Oral, resp. rate 18, height 6\' 2"  (1.88 m), weight 177 lb 1.6 oz (80.332 kg), SpO2 98 %.  PHYSICAL EXAM:  General: Alert, oriented x3, no distress Head: no evidence of trauma, PERRL, EOMI, no exophtalmos or lid lag, no myxedema, no xanthelasma; normal ears, nose and oropharynx Neck: normal jugular venous pulsations and no hepatojugular reflux; brisk carotid pulses without delay and no carotid bruits Chest: clear to auscultation, no signs of consolidation by percussion or palpation, normal fremitus, symmetrical and full respiratory excursions Cardiovascular: normal position and quality of the apical impulse, regular rhythm, normal first heart sound and normal second heart sound, no rubs or gallops, very faint early systolic murmur Abdomen: no tenderness or distention, no masses by palpation, no abnormal pulsatility or arterial bruits, normal bowel sounds, no hepatosplenomegaly Extremities: no clubbing, cyanosis;  no edema; 2+ radial, ulnar and brachial pulses bilaterally; 2+ right femoral, posterior tibial and dorsalis pedis pulses; 2+ left femoral, posterior tibial and dorsalis pedis pulses; no subclavian or femoral  bruits Neurological: grossly nonfocal   LABS  CBC  Recent Labs  08/01/14 1115 08/02/14 0245  WBC 6.1 6.0  NEUTROABS 4.2  --   HGB 13.6 13.1  HCT 40.6 40.3  MCV 86.0 84.1  PLT 216 272   Basic Metabolic Panel  Recent Labs  08/01/14 1115 08/02/14 0245  NA 138 136*  K 4.5 4.0  CL 103 98  CO2 23 27  GLUCOSE 104* 102*  BUN 21 20  CREATININE 0.78 0.74  CALCIUM 8.9 9.3   Liver Function Tests  Recent Labs  08/01/14 1115  AST 23  ALT 16  ALKPHOS 84  BILITOT 0.2*  PROT 6.5  ALBUMIN 3.5    Recent Labs  08/01/14 1115  LIPASE 42   Cardiac Enzymes  Recent Labs  08/01/14 1612 08/01/14 1903 08/01/14 2107  TROPONINI 0.35* 0.38* 0.31*   BNP Invalid input(s): POCBNP D-Dimer No results for input(s): DDIMER in the last 72 hours. Hemoglobin A1C  Recent Labs  08/02/14 0245  HGBA1C 6.4*   Fasting Lipid Panel  Recent Labs  08/02/14 0245  CHOL 278*  HDL 37*  LDLCALC 187*  TRIG 270*  CHOLHDL 7.5   Thyroid Function Tests No results for input(s): TSH, T4TOTAL, T3FREE, THYROIDAB in the last 72 hours.  Invalid input(s): FREET3    IMAGING: Dg Chest 2 View  08/01/2014   CLINICAL DATA:  Chest pressure.  Prostate cancer  EXAM: CHEST  2 VIEW  COMPARISON:  12/17/2007.  07/31/2010.  FINDINGS: Mediastinum and hilar structures are normal. Multiple sclerotic nodular densities are noted projected over the chest in what appear to be the ribs. Sclerotic lesions are noted in both proximal humeri, right scapula, most likely the spine. Right lower rib fractures are noted. These most likely pathologic. These findings are most consistent with blastic metastatic disease from prostate cancer. These findings are new from prior exam. Given the multitude of pulmonary nodular densities projected over the chest (ribs) underlying pulmonary nodules cannot be excluded. Chest CT may prove useful for further evaluation. Whole-body bone scan can be obtained for further evaluation. Left  base pleural parenchymal scarring. Heart size normal. No pleural effusion or pneumothorax.  IMPRESSION: 1. Multiple sclerotic densities noted over the chest. These densities are most likely metastatic lesions to the ribs. Sclerotic lesions are noted in both proximal humeri, right scapula, and most likely in the spine. These findings are also most likely secondary to metastatic  disease. Given the multitude of nodular densities over the chest underlying pulmonary nodules cannot be excluded. Chest CT can be obtained if needed. Whole-body bone scan would be useful for further evaluation. 2. Left base pleural parenchymal scarring.   Electronically Signed   By: Marcello Moores  Register   On: 08/01/2014 13:17   Ct Angio Chest Pe W/cm &/or Wo Cm  08/01/2014   CLINICAL DATA:  Chest pain and shortness of Breath, history of carcinoma of the prostate with bony metastatic disease  EXAM: CT ANGIOGRAPHY CHEST WITH CONTRAST  TECHNIQUE: Multidetector CT imaging of the chest was performed using the standard protocol during bolus administration of intravenous contrast. Multiplanar CT image reconstructions and MIPs were obtained to evaluate the vascular anatomy.  CONTRAST:  42mL OMNIPAQUE IOHEXOL 350 MG/ML SOLN  COMPARISON:  None.  FINDINGS: Lungs are well aerated bilaterally with mild bibasilar atelectatic changes. No sizable parenchymal nodules are seen. The thoracic aorta shows no aneurysmal dilatation. The pulmonary artery shows a normal branching pattern bilaterally. No intraluminal filling defect to suggest pulmonary embolism is identified. No significant hilar or mediastinal adenopathy is seen. Coronary calcifications are noted. No right heart strain is seen. The upper abdomen shows fatty infiltration of the liver. No other focal abnormality is seen. The bony structures show diffuse metastatic disease consistent with the patient's given clinical history.  Review of the MIP images confirms the above findings.  IMPRESSION: No evidence  of pulmonary emboli.  Bibasilar atelectatic changes.  Extensive bony metastatic disease consistent with the patient's history.   Electronically Signed   By: Inez Catalina M.D.   On: 08/01/2014 15:30    ECG: NSR, lateral T wave inversion  TELEMETRY: NSr  IMPRESSION: 1. Unstable angina with high risk biomarkers and abnormal ECG 2. Widely metastatic prostate cancer, currently stable and with good response to antiandrogen therapy, good functional status 3. Hypercholesterolemia  RECOMMENDATION: 1. Urgent cardiac catheterization. If anatomy is amenable to PCI, this is reasonable. Prostate cancer limits prognosis and CABG is probably not reasonable. This procedure has been fully reviewed with the patient and written informed consent has been obtained. 2. Low dose beta blocker (relative bradycardia) and statin (if he has been taking Crestor 20 mg daily consistently, need to add Zetia).   Time Spent Directly with Patient: 60 minutes  Sanda Klein, MD, [facility] [phone] office [phone] pager   08/02/2014, 11:00 AM

## 2014-08-03 ENCOUNTER — Encounter (HOSPITAL_COMMUNITY): Payer: Self-pay | Admitting: Physician Assistant

## 2014-08-03 DIAGNOSIS — I214 Non-ST elevation (NSTEMI) myocardial infarction: Principal | ICD-10-CM

## 2014-08-03 LAB — BASIC METABOLIC PANEL
Anion gap: 13 (ref 5–15)
BUN: 19 mg/dL (ref 6–23)
CO2: 25 mEq/L (ref 19–32)
CREATININE: 0.82 mg/dL (ref 0.50–1.35)
Calcium: 9.3 mg/dL (ref 8.4–10.5)
Chloride: 100 mEq/L (ref 96–112)
GFR, EST NON AFRICAN AMERICAN: 82 mL/min — AB (ref >90)
Glucose, Bld: 94 mg/dL (ref 70–99)
Potassium: 5 mEq/L (ref 3.7–5.3)
Sodium: 138 mEq/L (ref 137–147)

## 2014-08-03 LAB — CBC
HCT: 41.5 % (ref 39.0–52.0)
Hemoglobin: 13.6 g/dL (ref 13.0–17.0)
MCH: 27.6 pg (ref 26.0–34.0)
MCHC: 32.8 g/dL (ref 30.0–36.0)
MCV: 84.2 fL (ref 78.0–100.0)
PLATELETS: 220 10*3/uL (ref 150–400)
RBC: 4.93 MIL/uL (ref 4.22–5.81)
RDW: 14.5 % (ref 11.5–15.5)
WBC: 7.2 10*3/uL (ref 4.0–10.5)

## 2014-08-03 LAB — TSH: TSH: 1.72 u[IU]/mL (ref 0.350–4.500)

## 2014-08-03 MED ORDER — ASPIRIN 81 MG PO CHEW: 81.0000 mg | CHEWABLE_TABLET | Freq: Every day | ORAL | Status: DC

## 2014-08-03 MED ORDER — METOPROLOL TARTRATE 25 MG PO TABS: 12.5000 mg | ORAL_TABLET | Freq: Two times a day (BID) | ORAL | Status: DC

## 2014-08-03 MED ORDER — CLOPIDOGREL BISULFATE 75 MG PO TABS: 75.0000 mg | ORAL_TABLET | Freq: Every day | ORAL | Status: DC

## 2014-08-03 NOTE — Progress Notes (Addendum)
Family Medicine Teaching Service Daily Progress Note Intern Pager: [phone]  Patient name: Frank Petersen Medical record number: [id number] Date of birth: September 24, 1932 Age: 80 y.o. Gender: male  Primary Care Provider: Pcp Not In System Consultants: Cardiology Code Status: Full (discussed on admission)  Pt Overview and Major Events to Date:  12/15: Troponins trending down 12/16: s/p L Cardiac Cath w/ stent x2   Assessment and Plan: Frank Petersen is a 80 y.o. male presenting with chest pain . PMH is significant for prostate cancer with metastasis to bones, h/o borderline HTN, acid reflux and anxiety  Chest pain: HPI concerning for ACS but no h/o heart disease. Heart score of 4. Initial troponin negative. I-stat 0.13. Repeat lab trop: 0.35. EKG: with possible old infarct. CXR: no acute processes, mets visible. CTA chest with no PE. MI vs reflux vs anxiety.EKG 12/16 unchanged. Troponins trending down. CBC unremarkable. Na 136. LDL 187, HDL 37.  A1c 6.4 s/p cardiac cath w/ stents x2 OM2.  EKG 12/17: sinus brady but otherwise unchanged. -Vitals per floor protocol -Zofran PRN Nausea -c/s cardiology, appreciate recs -Metoprolol 12.5 BID & Plavix started 12/16.  Will need to continue ASA and Plavix x1 year -Crestor, Will defer to PCP to add Zetia as patient has not been compliant with statin.  Prostate cancer with bone mets: On hormone therapy q6 months -Continue home Tramadol 100mg  BID for pain, Neurontin 600 BID -Does NOT tolerate hydrocodone/tylenol #3/morphine well: nausea, itching  Acid Reflux: on Omeprazole at home -Protonix while in hospital -GI cocktail now  Anxiety: -Continue home Zoloft  HLD: Patient has not been taking this for the last month -Continue home Crestor -Patient voices good understanding of importance of medication and agrees to take.  Allergies:  -Continue home Patanol  FEN/GI: Heart healthy diet, SLIV Prophylaxis: Lovenox  Disposition:  Discharge this afternoon.  Subjective:  Patient reports that he is feeling much better today.  He denies any chest pain, SOB, N/V, dizziness, sweating, headache.  He states that he is ready to go home.   RN conversing at bedside with patient on prevention and lifestyle modifications.  Objective: Temp:  [97.6 F (36.4 C)-98.2 F (36.8 C)] 98 F (36.7 C) (12/17 0722) Pulse Rate:  [46-64] 61 (12/17 0722) Resp:  [16-20] 20 (12/17 0722) BP: (108-158)/(59-91) 158/84 mmHg (12/17 0722) SpO2:  [96 %-99 %] 98 % (12/17 0722) Weight:  [179 lb 14.3 oz (81.6 kg)-180 lb (81.647 kg)] 179 lb 14.3 oz (81.6 kg) (12/17 0008) Physical Exam: General: awake, alert, sitting up at bedside, NAD, RN at bedside HEENT: [place]/AT, EOMI, MMM Cardiovascular: S1S2, RRR, no m/r/g, brisk cap refill Respiratory: CTAB anteriorly, no wheeze, no increased WOB Abdomen: soft, NT/ND, +BS Extremities: WWP, no cyanosis, clubbing or edema; procedure site covered with bandage Skin: dry, intact, no rashes Neuro: AAOx3, hard of hearing, no focal deficits otherwise  Laboratory:  Recent Labs Lab 08/01/14 1115 08/02/14 0245 08/03/14 0349  WBC 6.1 6.0 7.2  HGB 13.6 13.1 13.6  HCT 40.6 40.3 41.5  PLT 216 230 220    Recent Labs Lab 08/01/14 1115 08/02/14 0245 08/03/14 0349  NA 138 136* 138  K 4.5 4.0 5.0  CL 103 98 100  CO2 23 27 25   BUN 21 20 19   CREATININE 0.78 0.74 0.82  CALCIUM 8.9 9.3 9.3  PROT 6.5  --   --   BILITOT 0.2*  --   --   ALKPHOS 84  --   --   ALT 16  --   --  AST 23  --   --   GLUCOSE 104* 102* 94   Troponin: negative>>0.35>>0.38>>0.31  Imaging/Diagnostic Tests: Dg Chest 2 View  08/01/2014   CLINICAL DATA:  Chest pressure.  Prostate cancer  EXAM: CHEST  2 VIEW  COMPARISON:  12/17/2007.  07/31/2010.  FINDINGS: Mediastinum and hilar structures are normal. Multiple sclerotic nodular densities are noted projected over the chest in what appear to be the ribs. Sclerotic lesions are noted in both  proximal humeri, right scapula, most likely the spine. Right lower rib fractures are noted. These most likely pathologic. These findings are most consistent with blastic metastatic disease from prostate cancer. These findings are new from prior exam. Given the multitude of pulmonary nodular densities projected over the chest (ribs) underlying pulmonary nodules cannot be excluded. Chest CT may prove useful for further evaluation. Whole-body bone scan can be obtained for further evaluation. Left base pleural parenchymal scarring. Heart size normal. No pleural effusion or pneumothorax.  IMPRESSION: 1. Multiple sclerotic densities noted over the chest. These densities are most likely metastatic lesions to the ribs. Sclerotic lesions are noted in both proximal humeri, right scapula, and most likely in the spine. These findings are also most likely secondary to metastatic disease. Given the multitude of nodular densities over the chest underlying pulmonary nodules cannot be excluded. Chest CT can be obtained if needed. Whole-body bone scan would be useful for further evaluation. 2. Left base pleural parenchymal scarring.   Electronically Signed   By: Marcello Moores  Register   On: 08/01/2014 13:17   Ct Angio Chest Pe W/cm &/or Wo Cm  08/01/2014   CLINICAL DATA:  Chest pain and shortness of Breath, history of carcinoma of the prostate with bony metastatic disease  EXAM: CT ANGIOGRAPHY CHEST WITH CONTRAST  TECHNIQUE: Multidetector CT imaging of the chest was performed using the standard protocol during bolus administration of intravenous contrast. Multiplanar CT image reconstructions and MIPs were obtained to evaluate the vascular anatomy.  CONTRAST:  59mL OMNIPAQUE IOHEXOL 350 MG/ML SOLN  COMPARISON:  None.  FINDINGS: Lungs are well aerated bilaterally with mild bibasilar atelectatic changes. No sizable parenchymal nodules are seen. The thoracic aorta shows no aneurysmal dilatation. The pulmonary artery shows a normal  branching pattern bilaterally. No intraluminal filling defect to suggest pulmonary embolism is identified. No significant hilar or mediastinal adenopathy is seen. Coronary calcifications are noted. No right heart strain is seen. The upper abdomen shows fatty infiltration of the liver. No other focal abnormality is seen. The bony structures show diffuse metastatic disease consistent with the patient's given clinical history.  Review of the MIP images confirms the above findings.  IMPRESSION: No evidence of pulmonary emboli.  Bibasilar atelectatic changes.  Extensive bony metastatic disease consistent with the patient's history.   Electronically Signed   By: Inez Catalina M.D.   On: 08/01/2014 15:30     Janora Norlander, DO 08/03/2014, 8:07 AM PGY-1, [place] Intern pager: [phone], text pages welcome

## 2014-08-03 NOTE — Discharge Instructions (Signed)
We are so glad to see that you are feeling better.  You were admitted with Chest pain and found to have had a heart attack (NSTEMI).  You were evaluated by cardiology who place 2 stents in your heart to open your heart vessels.  You will need to see cardiology soon for continued monitoring.  You are being discharged on several new medications that you should take EVERY DAY: Plavix 75mg , Metoprolol 12.5mg , and Asprin 81mg .  In addition please make sure that you take your Crestor 20mg  every day.  Please make sure to make the lifestyle modifications reviewed with you by your nurse and doctors.  Also, please make an appointment to see your Primary Care doctor.  Chest Pain (Nonspecific) It is often hard to give a specific diagnosis for the cause of chest pain. There is always a chance that your pain could be related to something serious, such as a heart attack or a blood clot in the lungs. You need to follow up with your health care provider for further evaluation. CAUSES   Heartburn.  Pneumonia or bronchitis.  Anxiety or stress.  Inflammation around your heart (pericarditis) or lung (pleuritis or pleurisy).  A blood clot in the lung.  A collapsed lung (pneumothorax). It can develop suddenly on its own (spontaneous pneumothorax) or from trauma to the chest.  Shingles infection (herpes zoster virus). The chest wall is composed of bones, muscles, and cartilage. Any of these can be the source of the pain.  The bones can be bruised by injury.  The muscles or cartilage can be strained by coughing or overwork.  The cartilage can be affected by inflammation and become sore (costochondritis). DIAGNOSIS  Lab tests or other studies may be needed to find the cause of your pain. Your health care provider may have you take a test called an ambulatory electrocardiogram (ECG). An ECG records your heartbeat patterns over a 24-hour period. You may also have other tests, such as:  Transthoracic echocardiogram  (TTE). During echocardiography, sound waves are used to evaluate how blood flows through your heart.  Transesophageal echocardiogram (TEE).  Cardiac monitoring. This allows your health care provider to monitor your heart rate and rhythm in real time.  Holter monitor. This is a portable device that records your heartbeat and can help diagnose heart arrhythmias. It allows your health care provider to track your heart activity for several days, if needed.  Stress tests by exercise or by giving medicine that makes the heart beat faster. TREATMENT   Treatment depends on what may be causing your chest pain. Treatment may include:  Acid blockers for heartburn.  Anti-inflammatory medicine.  Pain medicine for inflammatory conditions.  Antibiotics if an infection is present.  You may be advised to change lifestyle habits. This includes stopping smoking and avoiding alcohol, caffeine, and chocolate.  You may be advised to keep your head raised (elevated) when sleeping. This reduces the chance of acid going backward from your stomach into your esophagus. Most of the time, nonspecific chest pain will improve within 2-3 days with rest and mild pain medicine.  HOME CARE INSTRUCTIONS   If antibiotics were prescribed, take them as directed. Finish them even if you start to feel better.  For the next few days, avoid physical activities that bring on chest pain. Continue physical activities as directed.  Do not use any tobacco products, including cigarettes, chewing tobacco, or electronic cigarettes.  Avoid drinking alcohol.  Only take medicine as directed by your health care provider.  Follow your health care provider's suggestions for further testing if your chest pain does not go away.  Keep any follow-up appointments you made. If you do not go to an appointment, you could develop lasting (chronic) problems with pain. If there is any problem keeping an appointment, call to reschedule. SEEK  MEDICAL CARE IF:   Your chest pain does not go away, even after treatment.  You have a rash with blisters on your chest.  You have a fever. SEEK IMMEDIATE MEDICAL CARE IF:   You have increased chest pain or pain that spreads to your arm, neck, jaw, back, or abdomen.  You have shortness of breath.  You have an increasing cough, or you cough up blood.  You have severe back or abdominal pain.  You feel nauseous or vomit.  You have severe weakness.  You faint.  You have chills. This is an emergency. Do not wait to see if the pain will go away. Get medical help at once. Call your local emergency services (911 in [place] Do not drive yourself to the hospital. MAKE SURE YOU:   Understand these instructions.  Will watch your condition.  Will get help right away if you are not doing well or get worse. Document Released: 05/14/2005 Document Revised: 08/09/2013 Document Reviewed: 03/09/2008 [facility] Patient Information 2015 [place], [place]. This information is not intended to replace advice given to you by your health care provider. Make sure you discuss any questions you have with your health care provider.

## 2014-08-03 NOTE — Progress Notes (Signed)
Patient Name: Frank Petersen Date of Encounter: 08/03/2014  Primary cardiologist: new - Dr. Sallyanne Kuster   Principal Problem:   NSTEMI (non-ST elevated myocardial infarction) Active Problems:   Prostate cancer   GERD (gastroesophageal reflux disease)    SUBJECTIVE  Denies any CP or SOB overnight.   CURRENT MEDS . aspirin  81 mg Oral Daily  . aspirin  81 mg Oral Daily  . calcium-vitamin D  1 tablet Oral BID  . clopidogrel  75 mg Oral Q breakfast  . gabapentin  600 mg Oral BID  . metoprolol tartrate  12.5 mg Oral BID  . olopatadine  1 drop Both Eyes BID  . pantoprazole  40 mg Oral Daily  . rosuvastatin  20 mg Oral q1800  . sertraline  150 mg Oral Daily  . traMADol  100 mg Oral BID    OBJECTIVE  Filed Vitals:   08/02/14 2200 08/03/14 0008 08/03/14 0354 08/03/14 0722  BP: 129/61 139/75 158/73 158/84  Pulse: 55 52 60 61  Temp:  97.8 F (36.6 C) 98.1 F (36.7 C) 98 F (36.7 C)  TempSrc:  Oral Oral Oral  Resp:  18 20 20   Height:      Weight:  179 lb 14.3 oz (81.6 kg)    SpO2: 97% 99% 96% 98%    Intake/Output Summary (Last 24 hours) at 08/03/14 0900 Last data filed at 08/03/14 0700  Gross per 24 hour  Intake    573 ml  Output    475 ml  Net     98 ml   Filed Weights   08/02/14 0458 08/02/14 1930 08/03/14 0008  Weight: 177 lb 1.6 oz (80.332 kg) 180 lb (81.647 kg) 179 lb 14.3 oz (81.6 kg)    PHYSICAL EXAM  General: Pleasant, NAD. Neuro: Alert and oriented X 3. Moves all extremities spontaneously. Psych: Normal affect. HEENT:  Normal  Neck: Supple without bruits or JVD. Lungs:  Resp regular and unlabored, CTA. Heart: RRR no s3, s4, or murmurs. R radial cath site stable. Abdomen: Soft, non-tender, non-distended, BS + x 4.  Extremities: No clubbing, cyanosis or edema. DP/PT/Radials 2+ and equal bilaterally.  Accessory Clinical Findings  CBC  Recent Labs  08/01/14 1115 08/02/14 0245 08/03/14 0349  WBC 6.1 6.0 7.2  NEUTROABS 4.2  --   --   HGB  13.6 13.1 13.6  HCT 40.6 40.3 41.5  MCV 86.0 84.1 84.2  PLT 216 230 174   Basic Metabolic Panel  Recent Labs  08/02/14 0245 08/03/14 0349  NA 136* 138  K 4.0 5.0  CL 98 100  CO2 27 25  GLUCOSE 102* 94  BUN 20 19  CREATININE 0.74 0.82  CALCIUM 9.3 9.3   Liver Function Tests  Recent Labs  08/01/14 1115  AST 23  ALT 16  ALKPHOS 84  BILITOT 0.2*  PROT 6.5  ALBUMIN 3.5    Recent Labs  08/01/14 1115  LIPASE 42   Cardiac Enzymes  Recent Labs  08/01/14 2107 08/02/14 0939 08/02/14 1420  TROPONINI 0.31* 0.31* <0.30   Hemoglobin A1C  Recent Labs  08/02/14 0245  HGBA1C 6.4*   Fasting Lipid Panel  Recent Labs  08/02/14 0245  CHOL 278*  HDL 37*  LDLCALC 187*  TRIG 270*  CHOLHDL 7.5   Thyroid Function Tests  Recent Labs  08/03/14 0340  TSH 1.720    TELE NSR with HR 50-70s    ECG  NSR with HR 50s   Radiology/Studies  Dg  Chest 2 View  08/01/2014   CLINICAL DATA:  Chest pressure.  Prostate cancer  EXAM: CHEST  2 VIEW  COMPARISON:  12/17/2007.  07/31/2010.  FINDINGS: Mediastinum and hilar structures are normal. Multiple sclerotic nodular densities are noted projected over the chest in what appear to be the ribs. Sclerotic lesions are noted in both proximal humeri, right scapula, most likely the spine. Right lower rib fractures are noted. These most likely pathologic. These findings are most consistent with blastic metastatic disease from prostate cancer. These findings are new from prior exam. Given the multitude of pulmonary nodular densities projected over the chest (ribs) underlying pulmonary nodules cannot be excluded. Chest CT may prove useful for further evaluation. Whole-body bone scan can be obtained for further evaluation. Left base pleural parenchymal scarring. Heart size normal. No pleural effusion or pneumothorax.  IMPRESSION: 1. Multiple sclerotic densities noted over the chest. These densities are most likely metastatic lesions to the  ribs. Sclerotic lesions are noted in both proximal humeri, right scapula, and most likely in the spine. These findings are also most likely secondary to metastatic disease. Given the multitude of nodular densities over the chest underlying pulmonary nodules cannot be excluded. Chest CT can be obtained if needed. Whole-body bone scan would be useful for further evaluation. 2. Left base pleural parenchymal scarring.   Electronically Signed   By: Marcello Moores  Register   On: 08/01/2014 13:17   Ct Angio Chest Pe W/cm &/or Wo Cm  08/01/2014   CLINICAL DATA:  Chest pain and shortness of Breath, history of carcinoma of the prostate with bony metastatic disease  EXAM: CT ANGIOGRAPHY CHEST WITH CONTRAST  TECHNIQUE: Multidetector CT imaging of the chest was performed using the standard protocol during bolus administration of intravenous contrast. Multiplanar CT image reconstructions and MIPs were obtained to evaluate the vascular anatomy.  CONTRAST:  57mL OMNIPAQUE IOHEXOL 350 MG/ML SOLN  COMPARISON:  None.  FINDINGS: Lungs are well aerated bilaterally with mild bibasilar atelectatic changes. No sizable parenchymal nodules are seen. The thoracic aorta shows no aneurysmal dilatation. The pulmonary artery shows a normal branching pattern bilaterally. No intraluminal filling defect to suggest pulmonary embolism is identified. No significant hilar or mediastinal adenopathy is seen. Coronary calcifications are noted. No right heart strain is seen. The upper abdomen shows fatty infiltration of the liver. No other focal abnormality is seen. The bony structures show diffuse metastatic disease consistent with the patient's given clinical history.  Review of the MIP images confirms the above findings.  IMPRESSION: No evidence of pulmonary emboli.  Bibasilar atelectatic changes.  Extensive bony metastatic disease consistent with the patient's history.   Electronically Signed   By: Inez Catalina M.D.   On: 08/01/2014 15:30    ASSESSMENT  AND PLAN  1. NSTEMI  - cath 08/02/2014 DES x 2 to OM2  - continue ASA, plavix, BB, and statin  - walked with cardiac rehab this morning, no CP. Stable for discharge by [facility] from cardiology perspective once seen by Dr. Burt Knack  2. CAD: newly diagnosed, see above 3. Metastatic prostate cancer (to bone) s/p androgen suppresion therapy 4. HTN 5. HLD  Signed, Frank Petersen Pager: [phone]  Patient seen, examined. Available data reviewed. Agree with findings, assessment, and plan as outlined by Almyra Deforest, PA-C. The patient appears stable following PCI yesterday. His radial site is clear. Heart is regular rate and rhythm. Lungs are clear. There is no peripheral edema. Plan reviewed as above. I stressed the importance of medication  adherence with dual antiplatelet therapy. The patient should have Plavix for at least 12 months without interruption. Cardiology follow-up will be arranged.  Sherren Mocha, M.D. 08/03/2014 10:58 AM

## 2014-08-03 NOTE — Progress Notes (Signed)
CARDIAC REHAB PHASE I   PRE:  Rate/Rhythm: 68 SR  BP:  Supine: 158/84  Sitting:   Standing:    SaO2:   MODE:  Ambulation: 1000 ft   POST:  Rate/Rhythm: 75 SR  BP:  Supine:   Sitting: 153/87  Standing:    SaO2:  0805-0930 Pt tolerated ambulation well without c/o of cp or SOB. VS stable pt states that he felt good walking. Completed discharge education with pt. He voices understanding. Pt agrees to [place]. CRP in [place], will send referral.  Rodney Langton RN 08/03/2014 9:29 AM

## 2014-08-03 NOTE — Discharge Summary (Signed)
[facility] Discharge Summary  Patient name: Frank Petersen Medical record number: [id number] Date of birth: 1933-05-28 Age: 80 y.o. Gender: male Date of Admission: 08/01/2014  Date of Discharge: 08/03/2014 Admitting Physician: Dickie La, MD  Primary Care Provider: Pcp Not In System Consultants: Cardiology  Indication for Hospitalization: Chest pain  Discharge Diagnoses/Problem List:  Chest pain NSTEMI GERD Prostate Cancer  Disposition: Discharge Home  Discharge Condition: Stable  Discharge Exam:  BP 158/84 mmHg  Pulse 61  Temp(Src) 98 F (36.7 C) (Oral)  Resp 20  Ht 6\' 2"  (1.88 m)  Wt 179 lb 14.3 oz (81.6 kg)  BMI 23.09 kg/m2  SpO2 98%   General: awake, alert, sitting up at bedside, NAD, RN at bedside HEENT: [place]/AT, EOMI, MMM Cardiovascular: S1S2, RRR, no m/r/g, brisk cap refill Respiratory: CTAB anteriorly, no wheeze, no increased WOB Abdomen: soft, NT/ND, +BS Extremities: WWP, no cyanosis, clubbing or edema; procedure site covered with bandage Skin: dry, intact, no rashes Neuro: AAOx3, hearing deficit, no focal deficits otherwise  Brief Hospital Course:  Frank Petersen is a 80 y.o. male that presented to his PCPs office with chest pain.  He was sent to [facility] for evaluation after an abnormal EKG at PCP office.  PMH is significant for prostate cancer with metastasis to bones, h/o borderline HTN, acid reflux and anxiety.  In the ED, patient's EKG revealed what looked to be an old infarct but no prior EKGs were available for comparison.  Troponin initially was negative and other causes of CP were pursued including evaluated for PE with CTA chest.  This was negative for PE.  ED provider ordered a repeat I-stat Tropinin which was positive to 0.13.  FPTS was called for admission of patient for cardiac work up.  On exam, patient reported that CP had resolved.  He was comfortably sitting in bed, auditory deficit was appreciated (chronic) and he was  slightly bradycardic on auscultation.  His exam was otherwise unremarkable.  He was admitted for cardiac evaluation.  Cardiology was consulted.  Patient was monitored on telemetry and roponins were trended which initially increased but then began down-trending.  EKG was repeated the following morning which was stable from admission.  Cardiology's recommendation was cardiac catheterization, low dose BB, statin.  Patient underwent L cardiac cath with 2 stents placed in OM2.  Patient tolerated procedure well, and he was placed on Plavix and asprin, which he is to continue taking for the next year.  Patient was counseled on lifestyle modifications and new medication regimen.  Patient voiced good understanding.  He was discharged home in stable condition with close follow up with cardiology and recommendations for close follow up with his primary care doctor.  For patient's prostate cancer with bone mets, he was continued on his home dose of Tramadol for pain.  Issues for Follow Up:   Recommend follow up on compliance with new medication regimen  Significant Procedures: Left cardiac catheterization with stent placement  Significant Labs and Imaging:   Recent Labs Lab 08/01/14 1115 08/02/14 0245 08/03/14 0349  WBC 6.1 6.0 7.2  HGB 13.6 13.1 13.6  HCT 40.6 40.3 41.5  PLT 216 230 220    Recent Labs Lab 08/01/14 1115 08/02/14 0245 08/03/14 0349  NA 138 136* 138  K 4.5 4.0 5.0  CL 103 98 100  CO2 23 27 25   GLUCOSE 104* 102* 94  BUN 21 20 19   CREATININE 0.78 0.74 0.82  CALCIUM 8.9 9.3 9.3  ALKPHOS  84  --   --   AST 23  --   --   ALT 16  --   --   ALBUMIN 3.5  --   --    Cardiac Panel (last 3 results)  Recent Labs  08/01/14 2107 08/02/14 0939 08/02/14 1420  TROPONINI 0.31* 0.31* <0.30   Lipid Panel     Component Value Date/Time   CHOL 278* 08/02/2014 0245   TRIG 270* 08/02/2014 0245   HDL 37* 08/02/2014 0245   CHOLHDL 7.5 08/02/2014 0245   VLDL 54* 08/02/2014 0245    LDLCALC 187* 08/02/2014 0245   A1c: 6.4 TSH: 1.720  Urinalysis    Component Value Date/Time   COLORURINE YELLOW 08/01/2014 1125   APPEARANCEUR CLEAR 08/01/2014 1125   LABSPEC 1.023 08/01/2014 1125   PHURINE 6.0 08/01/2014 1125   GLUCOSEU 250* 08/01/2014 1125   HGBUR NEGATIVE 08/01/2014 1125   BILIRUBINUR NEGATIVE 08/01/2014 1125   KETONESUR NEGATIVE 08/01/2014 1125   PROTEINUR NEGATIVE 08/01/2014 1125   UROBILINOGEN 0.2 08/01/2014 1125   NITRITE NEGATIVE 08/01/2014 1125   LEUKOCYTESUR NEGATIVE 08/01/2014 1125    Dg Chest 2 View  08/01/2014   CLINICAL DATA:  Chest pressure.  Prostate cancer  EXAM: CHEST  2 VIEW  COMPARISON:  12/17/2007.  07/31/2010.  FINDINGS: Mediastinum and hilar structures are normal. Multiple sclerotic nodular densities are noted projected over the chest in what appear to be the ribs. Sclerotic lesions are noted in both proximal humeri, right scapula, most likely the spine. Right lower rib fractures are noted. These most likely pathologic. These findings are most consistent with blastic metastatic disease from prostate cancer. These findings are new from prior exam. Given the multitude of pulmonary nodular densities projected over the chest (ribs) underlying pulmonary nodules cannot be excluded. Chest CT may prove useful for further evaluation. Whole-body bone scan can be obtained for further evaluation. Left base pleural parenchymal scarring. Heart size normal. No pleural effusion or pneumothorax.  IMPRESSION: 1. Multiple sclerotic densities noted over the chest. These densities are most likely metastatic lesions to the ribs. Sclerotic lesions are noted in both proximal humeri, right scapula, and most likely in the spine. These findings are also most likely secondary to metastatic disease. Given the multitude of nodular densities over the chest underlying pulmonary nodules cannot be excluded. Chest CT can be obtained if needed. Whole-body bone scan would be useful for  further evaluation. 2. Left base pleural parenchymal scarring.   Electronically Signed   By: Marcello Moores  Register   On: 08/01/2014 13:17   Ct Angio Chest Pe W/cm &/or Wo Cm  08/01/2014   CLINICAL DATA:  Chest pain and shortness of Breath, history of carcinoma of the prostate with bony metastatic disease  EXAM: CT ANGIOGRAPHY CHEST WITH CONTRAST  TECHNIQUE: Multidetector CT imaging of the chest was performed using the standard protocol during bolus administration of intravenous contrast. Multiplanar CT image reconstructions and MIPs were obtained to evaluate the vascular anatomy.  CONTRAST:  69mL OMNIPAQUE IOHEXOL 350 MG/ML SOLN  COMPARISON:  None.  FINDINGS: Lungs are well aerated bilaterally with mild bibasilar atelectatic changes. No sizable parenchymal nodules are seen. The thoracic aorta shows no aneurysmal dilatation. The pulmonary artery shows a normal branching pattern bilaterally. No intraluminal filling defect to suggest pulmonary embolism is identified. No significant hilar or mediastinal adenopathy is seen. Coronary calcifications are noted. No right heart strain is seen. The upper abdomen shows fatty infiltration of the liver. No other focal abnormality is seen. The  bony structures show diffuse metastatic disease consistent with the patient's given clinical history.  Review of the MIP images confirms the above findings.  IMPRESSION: No evidence of pulmonary emboli.  Bibasilar atelectatic changes.  Extensive bony metastatic disease consistent with the patient's history.   Electronically Signed   By: Inez Catalina M.D.   On: 08/01/2014 15:30    Results/Tests Pending at Time of Discharge: none  Discharge Medications:    Medication List    STOP taking these medications        HYDROcodone-acetaminophen 5-500 MG per tablet  Commonly known as:  VICODIN      TAKE these medications        aspirin 81 MG chewable tablet  Chew 1 tablet (81 mg total) by mouth daily.     calcium-vitamin D 500-200  MG-UNIT per tablet  Commonly known as:  OSCAL WITH D  Take 1 tablet by mouth 2 (two) times daily.     clonazePAM 0.5 MG tablet  Commonly known as:  KLONOPIN  Take 0.5 mg by mouth 2 (two) times daily as needed.     clopidogrel 75 MG tablet  Commonly known as:  PLAVIX  Take 1 tablet (75 mg total) by mouth daily with breakfast.     denosumab 60 MG/ML Soln injection  Commonly known as:  PROLIA  Inject 60 mg into the skin every 6 (six) months. Administer in upper arm, thigh, or abdomen     diazepam 5 MG tablet  Commonly known as:  VALIUM  Take 1 tablet (5 mg total) by mouth every 6 (six) hours as needed (dizziness).     fluticasone 50 MCG/ACT nasal spray  Commonly known as:  FLONASE  Place 1 spray into the nose 2 (two) times daily.     gabapentin 300 MG capsule  Commonly known as:  NEURONTIN  Take 600 mg by mouth 2 (two) times daily.     leuprolide (6 Month) 45 MG injection  Commonly known as:  ELIGARD  Inject 45 mg into the skin every 6 (six) months.     meclizine 12.5 MG tablet  Commonly known as:  ANTIVERT  Take 12.5 mg by mouth 3 (three) times daily as needed.     metoprolol tartrate 25 MG tablet  Commonly known as:  LOPRESSOR  Take 0.5 tablets (12.5 mg total) by mouth 2 (two) times daily.     naproxen 500 MG tablet  Commonly known as:  NAPROSYN  Take 500 mg by mouth 2 (two) times daily with a meal.     olopatadine 0.1 % ophthalmic solution  Commonly known as:  PATANOL  Place 1 drop into both eyes 2 (two) times daily.     omeprazole 20 MG capsule  Commonly known as:  PRILOSEC  Take 20 mg by mouth daily.     rosuvastatin 20 MG tablet  Commonly known as:  CRESTOR  Take 20 mg by mouth daily.     sertraline 100 MG tablet  Commonly known as:  ZOLOFT  Take 150 mg by mouth daily.     traMADol 50 MG tablet  Commonly known as:  ULTRAM  Take 100 mg by mouth 2 (two) times daily.        Discharge Instructions: Please refer to Patient Instructions section of EMR  for full details.  Patient was counseled important signs and symptoms that should prompt return to medical care, changes in medications, dietary instructions, activity restrictions, and follow up appointments.   Follow-Up Appointments: Follow-up  Information    Follow up with Your PCP and cardiologist.      Follow up with Sanda Klein, MD.   Specialty:  Cardiology   Why:  Cardiology office will contact you to schedule followup, if you do not hear from [place] in 2 days, please give [place] a call   Contact information:   [address] [place] [address] [phone]       Follow up with Primary Care Doctor. Schedule an appointment as soon as possible for a visit in 1 week.   Why:  Hospital follow up      Janora Norlander, DO 08/03/2014, 7:29 PM PGY-1, [place]

## 2014-08-04 ENCOUNTER — Telehealth: Payer: Self-pay | Admitting: Cardiovascular Disease

## 2014-08-04 NOTE — Telephone Encounter (Signed)
Closed encounter °

## 2014-09-15 ENCOUNTER — Ambulatory Visit (INDEPENDENT_AMBULATORY_CARE_PROVIDER_SITE_OTHER): Payer: Federal, State, Local not specified - PPO | Admitting: Cardiovascular Disease

## 2014-09-15 ENCOUNTER — Encounter: Payer: Self-pay | Admitting: Cardiovascular Disease

## 2014-09-15 VITALS — BP 102/64 | HR 64 | Resp 16 | Ht 73.0 in | Wt 180.6 lb

## 2014-09-15 DIAGNOSIS — F32A Depression, unspecified: Secondary | ICD-10-CM

## 2014-09-15 DIAGNOSIS — I214 Non-ST elevation (NSTEMI) myocardial infarction: Secondary | ICD-10-CM

## 2014-09-15 DIAGNOSIS — E782 Mixed hyperlipidemia: Secondary | ICD-10-CM

## 2014-09-15 DIAGNOSIS — F329 Major depressive disorder, single episode, unspecified: Secondary | ICD-10-CM

## 2014-09-15 DIAGNOSIS — I2510 Atherosclerotic heart disease of native coronary artery without angina pectoris: Secondary | ICD-10-CM

## 2014-09-15 DIAGNOSIS — Z955 Presence of coronary angioplasty implant and graft: Secondary | ICD-10-CM

## 2014-09-15 MED ORDER — METOPROLOL TARTRATE 25 MG PO TABS: 12.5000 mg | ORAL_TABLET | Freq: Every day | ORAL | Status: DC

## 2014-09-15 NOTE — Patient Instructions (Signed)
DECREASE Metoprolol to 1/2 tablet at bedtime only.  [facility] will be notified to start.  You will be notified with an appointment to get started.  Dr. Sallyanne Kuster recommends that you schedule a follow-up appointment in: 6 months.

## 2014-09-18 ENCOUNTER — Encounter: Payer: Self-pay | Admitting: Cardiovascular Disease

## 2014-09-18 DIAGNOSIS — Z955 Presence of coronary angioplasty implant and graft: Secondary | ICD-10-CM

## 2014-09-18 DIAGNOSIS — I2510 Atherosclerotic heart disease of native coronary artery without angina pectoris: Secondary | ICD-10-CM | POA: Insufficient documentation

## 2014-09-18 DIAGNOSIS — E782 Mixed hyperlipidemia: Secondary | ICD-10-CM | POA: Insufficient documentation

## 2014-09-18 DIAGNOSIS — F32A Depression, unspecified: Secondary | ICD-10-CM | POA: Insufficient documentation

## 2014-09-18 DIAGNOSIS — F329 Major depressive disorder, single episode, unspecified: Secondary | ICD-10-CM | POA: Insufficient documentation

## 2014-09-18 HISTORY — DX: Presence of coronary angioplasty implant and graft: Z95.5

## 2014-09-18 NOTE — Assessment & Plan Note (Signed)
Seems to give a lot of hints as to this diagnosis, but it is still early after his acute illness to decide whether he needs medication. We'll reduce the dose of beta blocker as this may be causing both depression and fatigue.

## 2014-09-18 NOTE — Progress Notes (Signed)
Patient ID: Frank Petersen, male   DOB: 19-Jul-1933, 81 y.o.   MRN: [id number]      Reason for office visit CAD, recent non-ST segment elevation myocardial infarction  Frank Petersen is now roughly 6 weeks status post very small NSTEMI related to a 99% stenosis of the second oblique marginal branch of the left circumflex coronary artery, treated by placement of 2 drug-eluting stents on December 16 (2.5 x 15 mm and 2.25 x 15mm Xience Alpine). He has not had any cardiac complaints since that time. He has not yet enrolled in cardiac rehabilitation. Left ventricular systolic function is preserved and he did not have problems with congestive heart failure or arrhythmia.  Additional problems include long-standing systemic hypertension and metastatic prostate cancer on androgen deprivation therapy. He describes himself as lacking motivation and feeling "down". He admits to "crying a lot". He also describes fairly significant fatigue("no wind in my sails"). He has substantial hypoacuzia and is interested in receiving cochlear implants.   Allergies  Allergen Reactions  . Codeine Nausea Only  . Morphine Itching    Current Outpatient Prescriptions  Medication Sig Dispense Refill  . aspirin 81 MG chewable tablet Chew 1 tablet (81 mg total) by mouth daily. 90 tablet 3  . calcium-vitamin D (OSCAL WITH D) 500-200 MG-UNIT per tablet Take 1 tablet by mouth 2 (two) times daily.    . clonazePAM (KLONOPIN) 0.5 MG tablet Take 0.5 mg by mouth 2 (two) times daily as needed.    . clopidogrel (PLAVIX) 75 MG tablet Take 1 tablet (75 mg total) by mouth daily with breakfast. 90 tablet 3  . denosumab (PROLIA) 60 MG/ML SOLN injection Inject 60 mg into the skin every 6 (six) months. Administer in upper arm, thigh, or abdomen    . diazepam (VALIUM) 5 MG tablet Take 1 tablet (5 mg total) by mouth every 6 (six) hours as needed (dizziness). 15 tablet 0  . fluticasone (FLONASE) 50 MCG/ACT nasal spray Place 1 spray into the nose  2 (two) times daily. 1 g 2  . gabapentin (NEURONTIN) 300 MG capsule Take 600 mg by mouth 2 (two) times daily.    Marland Kitchen leuprolide, 6 Month, (ELIGARD) 45 MG injection Inject 45 mg into the skin every 6 (six) months.    . meclizine (ANTIVERT) 12.5 MG tablet Take 12.5 mg by mouth 3 (three) times daily as needed.    . metoprolol tartrate (LOPRESSOR) 25 MG tablet Take 0.5 tablets (12.5 mg total) by mouth at bedtime. 90 tablet 3  . naproxen (NAPROSYN) 500 MG tablet Take 500 mg by mouth 2 (two) times daily with a meal.    . olopatadine (PATANOL) 0.1 % ophthalmic solution Place 1 drop into both eyes 2 (two) times daily. 5 mL 12  . omeprazole (PRILOSEC) 20 MG capsule Take 20 mg by mouth daily.    . rosuvastatin (CRESTOR) 20 MG tablet Take 20 mg by mouth daily.    . sertraline (ZOLOFT) 100 MG tablet Take 150 mg by mouth daily.     . traMADol (ULTRAM) 50 MG tablet Take 100 mg by mouth 2 (two) times daily.      No current facility-administered medications for this visit.    Past Medical History  Diagnosis Date  . Heart murmur   . GERD (gastroesophageal reflux disease)   . Vertigo   . High cholesterol     hx (08/01/2014)  . Hypertension     hx (08/01/2014)  . Migraine     "stopped ~  age 30" (08/01/2014)  . Arthritis     "left shoulder" (08/01/2014)  . Chronic lower back pain   . Anxiety   . Depression   . Prostate cancer metastatic to bone     "stage IV" (08/01/2014)  . CAD (coronary artery disease)     s/p cath 08/02/2014 DES x 2 to RCA  . Stented coronary artery 09/18/2014    OM 2 branch of left circumflex to 08/02/2014 with 2 drug-eluting stents (2.5 x 15 mm and 2.25 x 22mm Xience Alpine)    Past Surgical History  Procedure Laterality Date  . Prostatectomy  2007  . Tonsillectomy    . Pilonidal cyst excision  1960's  . Left heart catheterization with coronary angiogram N/A 08/02/2014    Procedure: LEFT HEART CATHETERIZATION WITH CORONARY ANGIOGRAM;  Surgeon: Burnell Blanks, MD;   Location: [facility] CATH LAB;  Service: Cardiovascular;  Laterality: N/A;  . Percutaneous coronary stent intervention (pci-s)  08/02/2014    Procedure: PERCUTANEOUS CORONARY STENT INTERVENTION (PCI-S);  Surgeon: Burnell Blanks, MD;  Location: [facility] C[facility] LAB;  Service: Cardiovascular;;    No family history on file.  History   Social History  . Marital Status: Married    Spouse Name: N/A    Number of Children: N/A  . Years of Education: N/A   Occupational History  . Not on file.   Social History Main Topics  . Smoking status: Never Smoker   . Smokeless tobacco: Never Used  . Alcohol Use: Yes     Comment: 08/01/2014 "stopped drinking in ~ 2007; after rehab"  . Drug Use: No  . Sexual Activity: Yes   Other Topics Concern  . Not on file   Social History Narrative    Review of systems: The patient specifically denies any chest pain at rest or with exertion, dyspnea at rest or with exertion, orthopnea, paroxysmal nocturnal dyspnea, syncope, palpitations, focal neurological deficits, intermittent claudication, lower extremity edema, unexplained weight gain, cough, hemoptysis or wheezing.  The patient also denies abdominal pain, nausea, vomiting, dysphagia, diarrhea, constipation, polyuria, polydipsia, dysuria, hematuria, frequency, urgency, abnormal bleeding or bruising, fever, chills, unexpected weight changes, change in skin or hair texture, change in voice quality, auditory or visual problems, allergic reactions or rashes, new musculoskeletal complaints other than usual "aches and pains".   PHYSICAL EXAM BP 102/64 mmHg  Pulse 64  Resp 16  Ht 6\' 1"  (1.854 m)  Wt 180 lb 9.6 oz (81.92 kg)  BMI 23.83 kg/m2  General: Alert, oriented x3, no distress Head: no evidence of trauma, PERRL, EOMI, no exophtalmos or lid lag, no myxedema, no xanthelasma; normal ears, nose and oropharynx Neck: normal jugular venous pulsations and no hepatojugular reflux; brisk carotid pulses without delay and  no carotid bruits Chest: clear to auscultation, no signs of consolidation by percussion or palpation, normal fremitus, symmetrical and full respiratory excursions Cardiovascular: normal position and quality of the apical impulse, regular rhythm, normal first and second heart sounds, no murmurs, rubs or gallops Abdomen: no tenderness or distention, no masses by palpation, no abnormal pulsatility or arterial bruits, normal bowel sounds, no hepatosplenomegaly Extremities: no clubbing, cyanosis or edema; 2+ radial, ulnar and brachial pulses bilaterally; 2+ right femoral, posterior tibial and dorsalis pedis pulses; 2+ left femoral, posterior tibial and dorsalis pedis pulses; no subclavian or femoral bruits Neurological: grossly nonfocal   EKG: Sinus rhythm, first-degree AV block, occasional PACs, inverted T waves in leads 1 and aVL, similar to the hospital tracings  Lipid Panel  Component Value Date/Time   CHOL 278* 08/02/2014 0245   TRIG 270* 08/02/2014 0245   HDL 37* 08/02/2014 0245   CHOLHDL 7.5 08/02/2014 0245   VLDL 54* 08/02/2014 0245   LDLCALC 187* 08/02/2014 0245    BMET    Component Value Date/Time   NA 138 08/03/2014 0349   K 5.0 08/03/2014 0349   CL 100 08/03/2014 0349   CO2 25 08/03/2014 0349   GLUCOSE 94 08/03/2014 0349   BUN 19 08/03/2014 0349   CREATININE 0.82 08/03/2014 0349   CALCIUM 9.3 08/03/2014 0349   GFRNONAA 82* 08/03/2014 0349   GFRAA >90 08/03/2014 0349     ASSESSMENT AND PLAN  CAD s/p NSTEMI 07/2014 Stented coronary artery Reviewed the critical importance of uninterrupted dual antiplatelet therapy for a minimum of 6 months, preferably 12 months. His cochlear implants will have to wait. Start cardiac rehabilitation   Mixed hyperlipidemia Premature to reevaluate, has only been on statin therapy for 6 weeks   Depression Seems to give a lot of hints as to this diagnosis, but it is still early after his acute illness to decide whether he needs  medication. We'll reduce the dose of beta blocker as this may be causing both depression and fatigue.    Orders Placed This Encounter  Procedures  . EKG 12-Lead   Meds ordered this encounter  Medications  . metoprolol tartrate (LOPRESSOR) 25 MG tablet    Sig: Take 0.5 tablets (12.5 mg total) by mouth at bedtime.    Dispense:  90 tablet    Refill:  Lester Trevion Hoben, MD, [facility] [phone] office [phone] pager

## 2014-09-18 NOTE — Assessment & Plan Note (Signed)
Premature to reevaluate, has only been on statin therapy for 6 weeks

## 2014-09-18 NOTE — Assessment & Plan Note (Addendum)
Reviewed the critical importance of uninterrupted dual antiplatelet therapy for a minimum of 6 months, preferably 12 months. His cochlear implants will have to wait. Start cardiac rehabilitation

## 2014-09-28 ENCOUNTER — Encounter: Payer: Self-pay | Admitting: Cardiovascular Disease

## 2015-02-12 ENCOUNTER — Other Ambulatory Visit: Payer: Self-pay

## 2015-05-04 ENCOUNTER — Encounter: Payer: Self-pay | Admitting: Gastroenterology

## 2015-05-17 ENCOUNTER — Ambulatory Visit (INDEPENDENT_AMBULATORY_CARE_PROVIDER_SITE_OTHER): Payer: Federal, State, Local not specified - PPO | Admitting: Podiatry

## 2015-05-17 ENCOUNTER — Encounter: Payer: Self-pay | Admitting: Podiatry

## 2015-05-17 ENCOUNTER — Ambulatory Visit (INDEPENDENT_AMBULATORY_CARE_PROVIDER_SITE_OTHER): Payer: Federal, State, Local not specified - PPO

## 2015-05-17 VITALS — BP 119/66 | HR 69 | Resp 12

## 2015-05-17 DIAGNOSIS — M779 Enthesopathy, unspecified: Secondary | ICD-10-CM

## 2015-05-17 DIAGNOSIS — L97521 Non-pressure chronic ulcer of other part of left foot limited to breakdown of skin: Secondary | ICD-10-CM

## 2015-05-17 DIAGNOSIS — L89891 Pressure ulcer of other site, stage 1: Secondary | ICD-10-CM

## 2015-05-17 MED ORDER — MUPIROCIN 2 % EX OINT: TOPICAL_OINTMENT | CUTANEOUS | Status: DC

## 2015-05-17 NOTE — Progress Notes (Signed)
   Subjective:    Patient ID: Frank Petersen, male    DOB: 1933/04/16, 81 y.o.   MRN: [id number]  HPI: He presents with his wife today chief complaint of a painful hallux left. States that he has had a wound to the medial aspect of the IP joint hallux left the past 5 years that resulted from self debridement to alleviate calluses. He is concerned about the color change to the medial aspect of the toe and increased pain to the toe. He denies fever chills nausea vomiting muscle aches or pains. His wife confirms no infections to the site in question.  Review of Systems  HENT: Positive for hearing loss and sinus pressure.   Eyes: Positive for redness and visual disturbance.  Respiratory: Positive for cough and chest tightness.   Neurological: Positive for weakness.  Hematological: Bruises/bleeds easily.       Objective:   Physical Exam: 81 year old white male in no apparent distress presents final signs stable alert and oriented 3. Pulses are palpable but slightly diminished dorsalis pedis bilateral. Capillary fill time is intact. Left foot is warmer than the right. Neurologic sensorium is intact per Semmes-Weinstein monofilament. Deep tendon reflexes are intact. Muscle strength +5 over 5 dorsiflexion plantar flexors and inverters and everters all intrinsic musculature is intact. Orthopedic evaluation demonstrates all joints distal to the ankle have full range of motion without crepitation. Cutaneous evaluation to the straight supple well-hydrated cutis asteatotic eczema dorsal aspect right foot. No open lesions or wounds to the right foot. Mild reactive hyperkeratosis medial aspect of the IP joint hallux right. Left foot does demonstrate a reactive hyperkeratosis with mallet toe deformity and hammertoe deformities left. Reactive hyperkeratosis has been debrided resulting in a superficial ulceration medial aspect of the IP joint hallux left. Radiographs of the left foot today do not demonstrate any  type of osseus invasion or breakdown. This being at the site of ulceration.        Assessment & Plan:  Assessment: Superficial ulceration medial aspect IP joint hallux left. Non-complicated at this point.  Plan: Discussed etiology pathology conservative versus surgical therapies. I encouraged daily dressing after soaking with Epsom salts and the application of Bactroban ointment. I hope this will prevent him from picking at or trying to trim this lesion. I also encouraged the use of a Darco shoe at all times. I will follow-up with him in the near future to reevaluate. Should his symptoms worsen he is to notify us immediately.

## 2015-05-31 ENCOUNTER — Ambulatory Visit: Payer: Federal, State, Local not specified - PPO | Admitting: Gastroenterology

## 2015-06-03 ENCOUNTER — Encounter: Payer: Self-pay | Admitting: Cardiovascular Disease

## 2015-06-04 ENCOUNTER — Telehealth: Payer: Self-pay | Admitting: Cardiovascular Disease

## 2015-06-04 NOTE — Telephone Encounter (Signed)
Closed encounter °

## 2015-06-05 ENCOUNTER — Encounter: Payer: Self-pay | Admitting: Podiatry

## 2015-06-05 ENCOUNTER — Ambulatory Visit (INDEPENDENT_AMBULATORY_CARE_PROVIDER_SITE_OTHER): Payer: Federal, State, Local not specified - PPO | Admitting: Podiatry

## 2015-06-05 VITALS — BP 142/86 | HR 62 | Resp 12

## 2015-06-05 DIAGNOSIS — L89891 Pressure ulcer of other site, stage 1: Secondary | ICD-10-CM | POA: Diagnosis not present

## 2015-06-05 DIAGNOSIS — L97521 Non-pressure chronic ulcer of other part of left foot limited to breakdown of skin: Secondary | ICD-10-CM

## 2015-06-05 NOTE — Progress Notes (Signed)
He presents today for follow-up of his ulcer to his left foot. He states that while he was hiking in [place] he fell and broke his left arm. He states that he has not been peaking in his toe when he has been applying the medication as directed.  Objective: Vital signs are stable he is alert and oriented 3. Pulses are palpable left. Wound appears to be healing nicely hyperkeratotic tissue surrounding the ulceration is present once debridedno signs of infection.  Assessment: Chronic ulceration of the joint medial aspect hallux left without complications.  Plan: Continue all conservative therapies. Also debrided reactive hyperkeratosis for him today follow up with him in 3 weeks.

## 2015-06-26 ENCOUNTER — Ambulatory Visit: Payer: Federal, State, Local not specified - PPO | Admitting: Podiatry

## 2015-06-26 ENCOUNTER — Encounter: Payer: Self-pay | Admitting: Cardiovascular Disease

## 2015-06-26 ENCOUNTER — Ambulatory Visit (INDEPENDENT_AMBULATORY_CARE_PROVIDER_SITE_OTHER): Payer: Federal, State, Local not specified - PPO | Admitting: Cardiovascular Disease

## 2015-06-26 VITALS — BP 114/62 | HR 73 | Resp 16 | Ht 73.0 in | Wt 171.0 lb

## 2015-06-26 DIAGNOSIS — I2510 Atherosclerotic heart disease of native coronary artery without angina pectoris: Secondary | ICD-10-CM | POA: Diagnosis not present

## 2015-06-26 DIAGNOSIS — E782 Mixed hyperlipidemia: Secondary | ICD-10-CM | POA: Diagnosis not present

## 2015-06-26 DIAGNOSIS — Z79899 Other long term (current) drug therapy: Secondary | ICD-10-CM

## 2015-06-26 MED ORDER — ROSUVASTATIN CALCIUM 20 MG PO TABS: 20.0000 mg | ORAL_TABLET | Freq: Every day | ORAL | Status: DC

## 2015-06-26 NOTE — Progress Notes (Signed)
Patient ID: Frank Petersen, male   DOB: [birth year], 81 y.o.   MRN: [id number]     Cardiology Office Note   Date:  06/28/2015   ID:  Frank Petersen, DOB [birth year], MRN [id number]  PCP:  Pcp Not In System  Cardiologist:   Sanda Klein, MD   Chief Complaint  Patient presents with  . 6 MONTH FOLLOW UP      History of Present Illness: Frank Petersen is a 81 y.o. male who presents for CAD follow up. He is now roughly 1 year status post very small NSTEMI related to a 99% stenosis of the second oblique marginal branch of the left circumflex coronary artery, treated by placement of 2 drug-eluting stents on December 16 (2.5 x 15 mm and 2.25 x 13mm Xience Alpine). He has not had any cardiac complaints since that time.   He has metastatic prostate cancer and is on androgen deprivation therapy, to which he seems to responding well as far as I can tell. Depression continues to be an issue and his dose of sertraline has been increased to 200 mg daily not long ago. He has new hearing aids, which are helping. His wife is very supportive. He remains relatively inactive. He has some symptoms of acid reflux and difficulty swallowing.  He stopped taking Crestor. His physician at the [facility] advised him that would not make any difference for many years, implying I think that his prostate cancer might limit his lifespan, before he would have another vascular event.  Past Medical History  Diagnosis Date  . Heart murmur   . GERD (gastroesophageal reflux disease)   . Vertigo   . High cholesterol     hx (08/01/2014)  . Hypertension     hx (08/01/2014)  . Migraine     "stopped ~ age 54" (08/01/2014)  . Arthritis     "left shoulder" (08/01/2014)  . Chronic lower back pain   . Anxiety   . Depression   . Prostate cancer metastatic to bone [facility])     "stage IV" (08/01/2014)  . CAD (coronary artery disease)     s/p cath 08/02/2014 DES x 2 to RCA  . Stented coronary artery 09/18/2014    OM 2 branch of  left circumflex to 08/02/2014 with 2 drug-eluting stents (2.5 x 15 mm and 2.25 x 31mm Xience Alpine)    Past Surgical History  Procedure Laterality Date  . Prostatectomy  2007  . Tonsillectomy    . Pilonidal cyst excision  1960's  . Left heart catheterization with coronary angiogram N/A 08/02/2014    Procedure: LEFT HEART CATHETERIZATION WITH CORONARY ANGIOGRAM;  Surgeon: Burnell Blanks, MD;  Location: [facility] CATH LAB;  Service: Cardiovascular;  Laterality: N/A;  . Percutaneous coronary stent intervention (pci-s)  08/02/2014    Procedure: PERCUTANEOUS CORONARY STENT INTERVENTION (PCI-S);  Surgeon: Burnell Blanks, MD;  Location: [facility] CATH LAB;  Service: Cardiovascular;;     Current Outpatient Prescriptions  Medication Sig Dispense Refill  . aspirin 81 MG chewable tablet Chew 1 tablet (81 mg total) by mouth daily. 90 tablet 3  . calcium-vitamin D (OSCAL WITH D) 500-200 MG-UNIT per tablet Take 1 tablet by mouth 2 (two) times daily.    . clonazePAM (KLONOPIN) 0.5 MG tablet Take 0.5 mg by mouth 2 (two) times daily as needed.    . fluticasone (FLONASE) 50 MCG/ACT nasal spray Place 1 spray into the nose 2 (two) times daily. 1 g 2  . gabapentin (NEURONTIN) 300 MG  capsule Take 600 mg by mouth 2 (two) times daily.    Marland Kitchen leuprolide, 6 Month, (ELIGARD) 45 MG injection Inject 45 mg into the skin every 6 (six) months.    . meclizine (ANTIVERT) 12.5 MG tablet Take 12.5 mg by mouth 3 (three) times daily as needed.    . mupirocin ointment (BACTROBAN) 2 % Apply to wound twice a day. 30 g 2  . olopatadine (PATANOL) 0.1 % ophthalmic solution Place 1 drop into both eyes 2 (two) times daily. 5 mL 12  . pantoprazole (PROTONIX) 40 MG tablet Take 40 mg by mouth daily.    . sertraline (ZOLOFT) 100 MG tablet Take 150 mg by mouth daily.     . traMADol (ULTRAM) 50 MG tablet Take 100 mg by mouth 2 (two) times daily.     . Vitamin D, Cholecalciferol, 1000 UNITS TABS Take 1,000 Units by mouth daily.    Marland Kitchen  ZOLEDRONIC ACID IV Inject into the vein. EVERY THREE MONTHS    . rosuvastatin (CRESTOR) 20 MG tablet Take 1 tablet (20 mg total) by mouth daily. 90 tablet 3   No current facility-administered medications for this visit.    Allergies:   Codeine and Morphine    Social History:  The patient  reports that he has never smoked. He has never used smokeless tobacco. He reports that he drinks alcohol. He reports that he does not use illicit drugs.   Family History:  The patient's family history is reviewed and is not contributory .    ROS:  Please see the history of present illness.    Otherwise, review of systems positive for a variety of musculoskeletal aches and pains.   All other systems are reviewed and negative.    PHYSICAL EXAM: VS:  BP 114/62 mmHg  Pulse 73  Ht 6\' 1"  (1.854 m)  Wt 171 lb (77.565 kg)  BMI 22.57 kg/m2 , BMI Body mass index is 22.57 kg/(m^2).  General: Alert, oriented x3, no distress Head: no evidence of trauma, PERRL, EOMI, no exophtalmos or lid lag, no myxedema, no xanthelasma; normal ears, nose and oropharynx Neck: normal jugular venous pulsations and no hepatojugular reflux; brisk carotid pulses without delay and no carotid bruits Chest: clear to auscultation, no signs of consolidation by percussion or palpation, normal fremitus, symmetrical and full respiratory excursions Cardiovascular: normal position and quality of the apical impulse, regular rhythm, normal first and second heart sounds, no murmurs, rubs or gallops Abdomen: no tenderness or distention, no masses by palpation, no abnormal pulsatility or arterial bruits, normal bowel sounds, no hepatosplenomegaly Extremities: no clubbing, cyanosis or edema; 2+ radial, ulnar and brachial pulses bilaterally; 2+ right femoral, posterior tibial and dorsalis pedis pulses; 2+ left femoral, posterior tibial and dorsalis pedis pulses; no subclavian or femoral bruits Neurological: grossly nonfocal Psych: euthymic mood,  full affect   EKG:  EKG is ordered today. The ekg ordered today demonstrates normal sinus rhythm with first-degree AV block and minor intraventricular conduction delay (QRS 102 ms), QTC 451 ms   Recent Labs: 08/01/2014: ALT 16 08/03/2014: BUN 19; Creatinine, Ser 0.82; Hemoglobin 13.6; Platelets 220; Potassium 5.0; Sodium 138; TSH 1.720    Lipid Panel    Component Value Date/Time   CHOL 278* 08/02/2014 0245   TRIG 270* 08/02/2014 0245   HDL 37* 08/02/2014 0245   CHOLHDL 7.5 08/02/2014 0245   VLDL 54* 08/02/2014 0245   LDLCALC 187* 08/02/2014 0245      Wt Readings from Last 3 Encounters:  06/26/15  171 lb (77.565 kg)  09/15/14 180 lb 9.6 oz (81.92 kg)  08/03/14 179 lb 14.3 oz (81.6 kg)      ASSESSMENT AND PLAN:  CAD s/p NSTEMI 07/2014 Stented coronary artery He can now stop taking clopidogrel. I think the metoprolol may be contributing to his fatigue (although this is mostly depression I think). He is on such a tiny dose of metoprolol and is free of angina that I think stopping it will be beneficial.  Mixed hyperlipidemia Reviewed the fact that statins reduce the likelihood of recurrent vascular events soon after their initiated via plaque stabilization, even if the effect on plaque growth/plaque regression may take long periods of time. Restart statin, then reevaluate labs on statin therapy  Depression Improved, but still clearly an issue    Current medicines are reviewed at length with the patient today.  The patient does not have concerns regarding medicines.  The following changes have been made:  Stop clopidogrel and metoprolol  Labs/ tests ordered today include:  Orders Placed This Encounter  Procedures  . Comprehensive metabolic panel  . Lipid panel  . EKG 12-Lead    Patient Instructions  Your physician has recommended you make the following change in your medication:   STOP PLAVIX (CLOPIDOGREL) AND METOPROLOL  RESTART CRESTOR 20MG  DAILY  Your  physician recommends that you return for lab work in: 3 MONTHS FASTING AT [facility]  Dr. Sallyanne Kuster recommends that you schedule a follow-up appointment in: ONE YEAR         SignedSanda Klein, MD  06/28/2015 6:41 PM    Sanda Klein, MD, [facility] [phone] office [phone] pager

## 2015-06-26 NOTE — Patient Instructions (Signed)
Your physician has recommended you make the following change in your medication:   STOP PLAVIX (CLOPIDOGREL) AND METOPROLOL  RESTART CRESTOR 20MG  DAILY  Your physician recommends that you return for lab work in: 3 MONTHS FASTING AT [facility]  Dr. Sallyanne Kuster recommends that you schedule a follow-up appointment in: [place]

## 2015-07-03 ENCOUNTER — Encounter: Payer: Self-pay | Admitting: Podiatry

## 2015-07-03 ENCOUNTER — Ambulatory Visit (INDEPENDENT_AMBULATORY_CARE_PROVIDER_SITE_OTHER): Payer: Federal, State, Local not specified - PPO | Admitting: Podiatry

## 2015-07-03 VITALS — BP 112/79 | HR 109 | Resp 16

## 2015-07-03 DIAGNOSIS — L97521 Non-pressure chronic ulcer of other part of left foot limited to breakdown of skin: Secondary | ICD-10-CM

## 2015-07-03 DIAGNOSIS — L89891 Pressure ulcer of other site, stage 1: Secondary | ICD-10-CM | POA: Diagnosis not present

## 2015-07-03 NOTE — Progress Notes (Signed)
He presents today for follow-up of ulceration hallux left. He states it really doesn't look very good to me. He states that he has not been wearing his Darco shoe and that is evidenced by the clean bottom. He denies fever chills nausea vomiting muscle aches and pains.  Objective: Vital signs are stable alert and oriented 3. Pulses are strongly palpable. Neurologic sensorium is intact her Semmes-Weinstein monofilament. Deep tendon reflexes are intact. Ulceration demonstrate reactive hyperkeratosis once debrided demonstrates superficial ulceration approximately 6 mm in diameter. Does not probe deep to bone and is no sign of infection.  Assessment: Superficial ulceration IP joint hallux left possibly secondary to mild hallux limitus.  Plan: Discussed etiology pathology conservative versus surgical therapies. Debrided reactive hyperkeratosis recommended he continue Epsom salts and water soaks daily and dress with a dry sterile compressive dressing also I suggested that he utilize his Darco shoe at all times. I will follow-up with him in 2-3 weeks.

## 2015-07-24 ENCOUNTER — Ambulatory Visit (INDEPENDENT_AMBULATORY_CARE_PROVIDER_SITE_OTHER): Payer: Federal, State, Local not specified - PPO | Admitting: Podiatry

## 2015-07-24 ENCOUNTER — Encounter: Payer: Self-pay | Admitting: Podiatry

## 2015-07-24 VITALS — BP 134/80 | HR 83 | Resp 12

## 2015-07-24 DIAGNOSIS — L89891 Pressure ulcer of other site, stage 1: Secondary | ICD-10-CM | POA: Diagnosis not present

## 2015-07-24 DIAGNOSIS — L97521 Non-pressure chronic ulcer of other part of left foot limited to breakdown of skin: Secondary | ICD-10-CM

## 2015-07-24 MED ORDER — CLINDAMYCIN HCL 300 MG PO CAPS: 300.0000 mg | ORAL_CAPSULE | Freq: Three times a day (TID) | ORAL | Status: DC

## 2015-07-24 NOTE — Progress Notes (Signed)
Mr. Frank Petersen presents today very agitated and argumentative. He is upset regarding the parking situation and the fact that he had the [employer] and walk across a parking will he states that he fell while walking into the building through the parking lot and threatened lawsuit because there was not enough handicap parking he states that he did not injure himself when he fell and that there were no witnesses. He denies trauma. Regarding his left foot he states that the wound seems to be worsening rather than improving. I asked him if he has been wearing his Darco shoe and he admits that he has not been wearing the shoe regularly. He states that he has requested that his wife cut the distal aspect of the shoe off so that it is not a tripping hazard. He states that we have sized him inappropriately for this issue. He denies fever or chills nausea vomiting muscle aches or pains. He states that he continues to soak the foot daily apply Bactroban ointment and dress. He then goes on to state that he does wear his Darco shoe.  Objective: Vital signs are stable he is alert and oriented 3. Pulses are palpable and capillary fill time is immediate. Wound appears to demonstrate very little in the way of healing has a very mild odor no purulence but reactive granulation tissue. Once debrided it bled readily however this wound did not probe to bone. There is no purulence.  Assessment: Chronic ulceration medial aspect left foot, medial aspect IP joint hallux left.  Plan: Discussed etiology pathology conservative versus surgical therapies. I debrided the wound thoroughly today to bleeding placed silver nitrate on the wound as well as a Surgicel dressing. I encouraged to leave this dressing on for 2 days before changing it. I also wrote another prescription for clindamycin 300 mg 1 by mouth 3 times a day 10 days. I will follow-up with him in 2 weeks at which time we will take another set of x-rays and consider an MRI for  assessment of osteomyelitis. There is no reason this wound should not be healing based on the amount of blood flow and the care he is providing it. He did have a tendency to pick at the wound in the first place. I do not think this is the case at this point.

## 2015-08-14 ENCOUNTER — Ambulatory Visit: Payer: Federal, State, Local not specified - PPO | Admitting: Podiatry

## 2015-12-17 ENCOUNTER — Encounter (HOSPITAL_BASED_OUTPATIENT_CLINIC_OR_DEPARTMENT_OTHER): Payer: Self-pay | Admitting: Emergency Medicine

## 2015-12-17 ENCOUNTER — Emergency Department (HOSPITAL_BASED_OUTPATIENT_CLINIC_OR_DEPARTMENT_OTHER): Payer: Non-veteran care

## 2015-12-17 ENCOUNTER — Emergency Department (HOSPITAL_BASED_OUTPATIENT_CLINIC_OR_DEPARTMENT_OTHER)
Admission: EM | Admit: 2015-12-17 | Discharge: 2015-12-17 | Disposition: A | Discharge status: Home / Self Care | Payer: Non-veteran care | Attending: Emergency Medicine | Admitting: Emergency Medicine

## 2015-12-17 DIAGNOSIS — R079 Chest pain, unspecified: Secondary | ICD-10-CM | POA: Diagnosis present

## 2015-12-17 DIAGNOSIS — Z79899 Other long term (current) drug therapy: Secondary | ICD-10-CM | POA: Diagnosis not present

## 2015-12-17 DIAGNOSIS — C7951 Secondary malignant neoplasm of bone: Secondary | ICD-10-CM | POA: Insufficient documentation

## 2015-12-17 DIAGNOSIS — I10 Essential (primary) hypertension: Secondary | ICD-10-CM | POA: Diagnosis not present

## 2015-12-17 DIAGNOSIS — R531 Weakness: Secondary | ICD-10-CM | POA: Diagnosis not present

## 2015-12-17 DIAGNOSIS — C799 Secondary malignant neoplasm of unspecified site: Secondary | ICD-10-CM

## 2015-12-17 DIAGNOSIS — I2510 Atherosclerotic heart disease of native coronary artery without angina pectoris: Secondary | ICD-10-CM | POA: Insufficient documentation

## 2015-12-17 DIAGNOSIS — F329 Major depressive disorder, single episode, unspecified: Secondary | ICD-10-CM | POA: Diagnosis not present

## 2015-12-17 DIAGNOSIS — M1990 Unspecified osteoarthritis, unspecified site: Secondary | ICD-10-CM | POA: Insufficient documentation

## 2015-12-17 DIAGNOSIS — R002 Palpitations: Secondary | ICD-10-CM | POA: Insufficient documentation

## 2015-12-17 DIAGNOSIS — F17210 Nicotine dependence, cigarettes, uncomplicated: Secondary | ICD-10-CM | POA: Insufficient documentation

## 2015-12-17 DIAGNOSIS — Z8546 Personal history of malignant neoplasm of prostate: Secondary | ICD-10-CM | POA: Insufficient documentation

## 2015-12-17 LAB — CBC
HEMATOCRIT: 38.4 % — AB (ref 39.0–52.0)
Hemoglobin: 12.7 g/dL — ABNORMAL LOW (ref 13.0–17.0)
MCH: 27.1 pg (ref 26.0–34.0)
MCHC: 33.1 g/dL (ref 30.0–36.0)
MCV: 81.9 fL (ref 78.0–100.0)
Platelets: 314 10*3/uL (ref 150–400)
RBC: 4.69 MIL/uL (ref 4.22–5.81)
RDW: 15.3 % (ref 11.5–15.5)
WBC: 9.9 10*3/uL (ref 4.0–10.5)

## 2015-12-17 LAB — URINALYSIS, ROUTINE W REFLEX MICROSCOPIC
Bilirubin Urine: NEGATIVE
GLUCOSE, UA: NEGATIVE mg/dL
Hgb urine dipstick: NEGATIVE
Ketones, ur: NEGATIVE mg/dL
Leukocytes, UA: NEGATIVE
Nitrite: NEGATIVE
PH: 5.5 (ref 5.0–8.0)
PROTEIN: NEGATIVE mg/dL
SPECIFIC GRAVITY, URINE: 1.023 (ref 1.005–1.030)

## 2015-12-17 LAB — COMPREHENSIVE METABOLIC PANEL
ALBUMIN: 3.4 g/dL — AB (ref 3.5–5.0)
ALT: 16 U/L — AB (ref 17–63)
AST: 25 U/L (ref 15–41)
Alkaline Phosphatase: 332 U/L — ABNORMAL HIGH (ref 38–126)
Anion gap: 8 (ref 5–15)
BUN: 25 mg/dL — AB (ref 6–20)
CHLORIDE: 99 mmol/L — AB (ref 101–111)
CO2: 26 mmol/L (ref 22–32)
CREATININE: 0.69 mg/dL (ref 0.61–1.24)
Calcium: 8.8 mg/dL — ABNORMAL LOW (ref 8.9–10.3)
GFR calc Af Amer: >60 mL/min (ref >60)
GLUCOSE: 126 mg/dL — AB (ref 65–99)
Potassium: 4.4 mmol/L (ref 3.5–5.1)
Sodium: 133 mmol/L — ABNORMAL LOW (ref 135–145)
Total Bilirubin: 0.6 mg/dL (ref 0.3–1.2)
Total Protein: 7.5 g/dL (ref 6.5–8.1)

## 2015-12-17 LAB — LIPASE, BLOOD: LIPASE: 35 U/L (ref 11–51)

## 2015-12-17 LAB — TROPONIN I: Troponin I: <0.03 ng/mL (ref <0.031)

## 2015-12-17 MED ORDER — ONDANSETRON HCL 4 MG/2ML IJ SOLN
4.0000 mg | Freq: Once | INTRAMUSCULAR | Status: AC
  Administered 2015-12-17: 4 mg via INTRAVENOUS
  Filled 2015-12-17: qty 2

## 2015-12-17 MED ORDER — HYDROMORPHONE HCL 1 MG/ML IJ SOLN
0.5000 mg | Freq: Once | INTRAMUSCULAR | Status: AC
  Administered 2015-12-17: 0.5 mg via INTRAVENOUS
  Filled 2015-12-17: qty 1

## 2015-12-17 MED ORDER — SODIUM CHLORIDE 0.9 % IV BOLUS (SEPSIS)
500.0000 mL | Freq: Once | INTRAVENOUS | Status: AC
  Administered 2015-12-17: 500 mL via INTRAVENOUS

## 2015-12-17 NOTE — ED Notes (Signed)
Pt given urinal at this time, made aware of need for urine.

## 2015-12-17 NOTE — Discharge Instructions (Signed)
Return to the ED with any concerns including vomiting and not able to keep down liquids, difficulty breathing, fainting, decreased level of alertness/lethargy, or any other alarming symptoms

## 2015-12-17 NOTE — ED Notes (Addendum)
Heart palpitations this am.  Recently feeling lethargic for two years, worse since Saturday.  Pt has ca of prostate mets to bone.  Pt having pain to right hip/leg, and abdomen worsening since Saturday.

## 2015-12-17 NOTE — ED Provider Notes (Signed)
CSN: [id number]     Arrival date & time 12/17/15  1001 History   First MD Initiated Contact with Patient 12/17/15 1008     Chief Complaint  Patient presents with  . Palpitations  . Chest Pain     (Consider location/radiation/quality/duration/timing/severity/associated sxs/prior Treatment) HPI  Pt with hx of metastatic prostate cancer presenting with multiple complaints.  Primary complaint to me is about fatigue and weakness with poor appetite and decreased po intake.  He also c/o right hip and low back pain which is worse over the past 2 weeks.  Worse with movement and palpation.  He has not had any injury.  He also states that occasionally he will have sharp intermittent pains in his chest- not having these now.  Felt some palpitations this morning with heart racing.  No fainting.  No shortness of breath.  No current chest pain.  No leg swelling.  There are no other associated systemic symptoms, there are no other alleviating or modifying factors.  He reiterates his primary concern is hip pain and fatigue/dehydration.    Past Medical History  Diagnosis Date  . Heart murmur   . GERD (gastroesophageal reflux disease)   . Vertigo   . High cholesterol     hx (08/01/2014)  . Hypertension     hx (08/01/2014)  . Migraine     "stopped ~ age 73" (08/01/2014)  . Arthritis     "left shoulder" (08/01/2014)  . Chronic lower back pain   . Anxiety   . Depression   . Prostate cancer metastatic to bone [facility])     "stage IV" (08/01/2014)  . CAD (coronary artery disease)     s/p cath 08/02/2014 DES x 2 to RCA  . Stented coronary artery 09/18/2014    OM 2 branch of left circumflex to 08/02/2014 with 2 drug-eluting stents (2.5 x 15 mm and 2.25 x 82mm Xience [place]pine)   Past Surgical History  Procedure Laterality Date  . Prostatectomy  2007  . Tonsillectomy    . Pilonidal cyst excision  1960's  . Left heart catheterization with coronary angiogram N/A 08/02/2014    Procedure: LEFT HEART CATHETERIZATION  WITH CORONARY ANGIOGRAM;  Surgeon: Burnell Blanks, MD;  Location: [facility] CATH LAB;  Service: Cardiovascular;  Laterality: N/A;  . Percutaneous coronary stent intervention (pci-s)  08/02/2014    Procedure: PERCUTANEOUS CORONARY STENT INTERVENTION (PCI-S);  Surgeon: Burnell Blanks, MD;  Location: [facility] CATH LAB;  Service: Cardiovascular;;   No family history on file. Social History  Substance Use Topics  . Smoking status: Light Tobacco Smoker    Types: Cigarettes, Cigars  . Smokeless tobacco: Never Used  . Alcohol Use: Yes     Comment: 08/01/2014 "stopped drinking in ~ 2007; after rehab"    Review of Systems  ROS reviewed and all otherwise negative except for mentioned in HPI    Allergies  Codeine and Morphine  Home Medications   Prior to Admission medications   Medication Sig Start Date End Date Taking? Authorizing Provider  calcium-vitamin D (OSCAL WITH D) 500-200 MG-UNIT per tablet Take 1 tablet by mouth 2 (two) times daily.   Yes Historical Provider, MD  gabapentin (NEURONTIN) 300 MG capsule Take 600 mg by mouth 3 (three) times daily.  05/25/14  Yes Historical Provider, MD  sertraline (ZOLOFT) 100 MG tablet Take 200 mg by mouth daily.    Yes Historical Provider, MD  sipuleucel-T (PROVENGE) SUSP Inject 250 mLs into the vein once.   Yes Historical  Provider, MD  traMADol (ULTRAM) 50 MG tablet Take 100 mg by mouth 2 (two) times daily.  07/24/14  Yes Historical Provider, MD  Vitamin D, Cholecalciferol, 1000 UNITS TABS Take 1,000 Units by mouth daily.   Yes Historical Provider, MD  leuprolide, 6 Month, (ELIGARD) 45 MG injection Inject 45 mg into the skin every 6 (six) months.    Historical Provider, MD  LORazepam (ATIVAN) 0.5 MG tablet Take 0.5 mg by mouth every 8 (eight) hours.    Historical Provider, MD  meclizine (ANTIVERT) 12.5 MG tablet Take 12.5 mg by mouth 3 (three) times daily as needed.    Historical Provider, MD  ZOLEDRONIC ACID IV Inject into the vein. EVERY THREE  MONTHS    Historical Provider, MD   BP 110/80 mmHg  Pulse 89  Temp(Src) 98.4 F (36.9 C) (Oral)  Resp 14  SpO2 99%  Vitals reviewed Physical Exam  Physical Examination: General appearance - alert, well appearing, and in no distress Mental status - alert, oriented to person, place, and time Eyes - no conjunctival injection, no scleral icterus Mouth - mucous membranes moist, pharynx normal without lesions Chest - clear to auscultation, no wheezes, rales or rhonchi, symmetric air entry Heart - normal rate, regular rhythm, normal S1, S2, no murmurs, rubs, clicks or gallops Abdomen - soft, nontender, nondistended, no masses or organomegaly Neurological - alert, oriented, normal speech Musculoskeletal - ttp over lateral right hip, otherwise no joint tenderness, deformity or swelling Extremities - peripheral pulses normal, no pedal edema, no clubbing or cyanosis Skin - normal coloration and turgor, no rashes  ED Course  Procedures (including critical care time) Labs Review Labs Reviewed  CBC - Abnormal; Notable for the following:    Hemoglobin 12.7 (*)    HCT 38.4 (*)    All other components within normal limits  COMPREHENSIVE METABOLIC PANEL - Abnormal; Notable for the following:    Sodium 133 (*)    Chloride 99 (*)    Glucose, Bld 126 (*)    BUN 25 (*)    Calcium 8.8 (*)    Albumin 3.4 (*)    ALT 16 (*)    Alkaline Phosphatase 332 (*)    All other components within normal limits  URINALYSIS, ROUTINE W REFLEX MICROSCOPIC (NOT AT [facility]) - Abnormal; Notable for the following:    Color, Urine AMBER (*)    All other components within normal limits  TROPONIN I  LIPASE, BLOOD    Imaging Review Dg Chest 2 View  12/17/2015  CLINICAL DATA:  Shortness of breath, weakness and chest pain for a few days. History of metastatic prostate cancer. Initial encounter. EXAM: CHEST  2 VIEW COMPARISON:  CT chest and PA and lateral chest 08/01/2014. FINDINGS: Minimal linear scar in the left lung  base is unchanged. The lungs are otherwise clear. Heart size is normal. No pneumothorax or pleural effusion. Multifocal sclerotic lesions are identified consistent with metastatic prostate cancer and appear increased since the prior study. IMPRESSION: No acute disease. Multifocal prostate cancer metastases to bone have progressed since the prior exams. Electronically Signed   By: Inge Rise M.D.   On: 12/17/2015 10:49   Dg Hip Unilat With Pelvis 2-3 Views Right  12/17/2015  CLINICAL DATA:  Right hip pain for 2-3 days. No known injury. Stage IV metastatic prostate cancer. EXAM: DG HIP (WITH OR WITHOUT PELVIS) 2-3V RIGHT COMPARISON:  None. FINDINGS: Both hips are normally located. No acute hip fractures identified. No findings for avascular necrosis. No  significant degenerative changes. Diffuse osseous metastatic disease is noted without obvious pathologic fracture. IMPRESSION: Diffuse osseous metastatic disease but no pathologic fracture or evidence of AVN. Electronically Signed   By: Marijo Sanes M.D.   On: 12/17/2015 11:55   I have personally reviewed and evaluated these images and lab results as part of my medical decision-making.   EKG Interpretation   Date/Time:  Monday Dec 17 2015 10:09:48 EDT Ventricular Rate:  89 PR Interval:  245 QRS Duration: 102 QT Interval:  367 QTC Calculation: 446 R Axis:   47 Text Interpretation:  Sinus or ectopic atrial rhythm Prolonged PR interval  Abnormal T, consider ischemia, lateral leads Baseline wander in lead(s) II  III aVR aVF Since previous tracing rate faster Confirmed by [facility]  MD,  Bryan Omura [phone]) on 12/17/2015 10:18:20 AM      MDM   Final diagnoses:  Generalized weakness  Palpitations  Metastatic cancer ([place])   Pt presenting with c/o fatigue, decreased appetite, palpitations, right sided hip pain, intermittent chest pains- workup is reassuring.  He states he feels much improved after dilaudid and IV fluids. Right hip show metatastes- no  fracture or acute event.  EKG is unchanged and troponin is negative.  Doubt PE- he states the chest pains are intermittent and more like palpitations- he has no tachycardia or hypoxia.  No shortness of breath.  Pt is agreeable with plan for discharge, d/w patient to arrange for followup with his primary doctor about pain medications.  Discharged with strict return precautions.  Pt agreeable with plan.    Alfonzo Beers, MD 12/17/15 1321

## 2015-12-17 NOTE — ED Notes (Signed)
MD Linker at bedside, explaining results to patient and family.

## 2015-12-17 NOTE — ED Notes (Signed)
Pt underwent provinge therapy

## 2016-01-25 ENCOUNTER — Telehealth: Payer: Self-pay

## 2016-01-25 NOTE — Telephone Encounter (Signed)
lmtcb

## 2016-01-25 NOTE — Telephone Encounter (Signed)
Labs received from the [place] recommended that patient stop statin. Dr C disagrees and recommends that he go back on Crestor (rosuvastatin) 20 mg daily.  CHOL - 276 (ref range 50-200) TRIG - 208 (ref range 30-200) HDL - 42 (ref range 29.0-71.0) C-LDL - 192 (ref range 60-130)

## 2016-02-20 NOTE — Telephone Encounter (Signed)
Attempted to contact patient. Spoke with wife, Corporate treasurer ([place]). Reviewed information with her listed below. Beth states that he did go back on Crestor 20 mg daily. She will check with patient and make sure that he did in fact restart medication and call back.

## 2016-02-22 ENCOUNTER — Emergency Department (HOSPITAL_COMMUNITY): Payer: Federal, State, Local not specified - PPO

## 2016-02-22 ENCOUNTER — Encounter (HOSPITAL_COMMUNITY): Payer: Self-pay | Admitting: Emergency Medicine

## 2016-02-22 ENCOUNTER — Emergency Department (HOSPITAL_COMMUNITY)
Admission: EM | Admit: 2016-02-22 | Discharge: 2016-02-23 | Disposition: A | Discharge status: Home / Self Care | Payer: Federal, State, Local not specified - PPO | Attending: Emergency Medicine | Admitting: Emergency Medicine

## 2016-02-22 DIAGNOSIS — Z8546 Personal history of malignant neoplasm of prostate: Secondary | ICD-10-CM | POA: Insufficient documentation

## 2016-02-22 DIAGNOSIS — I10 Essential (primary) hypertension: Secondary | ICD-10-CM | POA: Insufficient documentation

## 2016-02-22 DIAGNOSIS — C61 Malignant neoplasm of prostate: Secondary | ICD-10-CM

## 2016-02-22 DIAGNOSIS — I2510 Atherosclerotic heart disease of native coronary artery without angina pectoris: Secondary | ICD-10-CM | POA: Insufficient documentation

## 2016-02-22 DIAGNOSIS — R5383 Other fatigue: Secondary | ICD-10-CM | POA: Insufficient documentation

## 2016-02-22 DIAGNOSIS — F17210 Nicotine dependence, cigarettes, uncomplicated: Secondary | ICD-10-CM | POA: Insufficient documentation

## 2016-02-22 DIAGNOSIS — C7951 Secondary malignant neoplasm of bone: Secondary | ICD-10-CM | POA: Diagnosis not present

## 2016-02-22 LAB — CBG MONITORING, ED: Glucose-Capillary: 140 mg/dL — ABNORMAL HIGH (ref 65–99)

## 2016-02-22 LAB — COMPREHENSIVE METABOLIC PANEL
ALBUMIN: 3.9 g/dL (ref 3.5–5.0)
ALK PHOS: 570 U/L — AB (ref 38–126)
ALT: 16 U/L — ABNORMAL LOW (ref 17–63)
ANION GAP: 8 (ref 5–15)
AST: 29 U/L (ref 15–41)
BUN: 21 mg/dL — ABNORMAL HIGH (ref 6–20)
CO2: 24 mmol/L (ref 22–32)
Calcium: 9.1 mg/dL (ref 8.9–10.3)
Chloride: 104 mmol/L (ref 101–111)
Creatinine, Ser: 0.7 mg/dL (ref 0.61–1.24)
GFR calc Af Amer: >60 mL/min (ref >60)
GFR calc non Af Amer: >60 mL/min (ref >60)
GLUCOSE: 139 mg/dL — AB (ref 65–99)
POTASSIUM: 4.5 mmol/L (ref 3.5–5.1)
SODIUM: 136 mmol/L (ref 135–145)
Total Bilirubin: 0.5 mg/dL (ref 0.3–1.2)
Total Protein: 7.2 g/dL (ref 6.5–8.1)

## 2016-02-22 LAB — TROPONIN I
Troponin I: <0.03 ng/mL (ref <0.03)
Troponin I: <0.03 ng/mL (ref <0.03)

## 2016-02-22 LAB — CBC
HEMATOCRIT: 45.2 % (ref 39.0–52.0)
Hemoglobin: 14.5 g/dL (ref 13.0–17.0)
MCH: 26 pg (ref 26.0–34.0)
MCHC: 32.1 g/dL (ref 30.0–36.0)
MCV: 81.1 fL (ref 78.0–100.0)
Platelets: 272 10*3/uL (ref 150–400)
RBC: 5.57 MIL/uL (ref 4.22–5.81)
RDW: 15.9 % — AB (ref 11.5–15.5)
WBC: 8.2 10*3/uL (ref 4.0–10.5)

## 2016-02-22 LAB — URINE MICROSCOPIC-ADD ON: RBC / HPF: NONE SEEN RBC/hpf (ref 0–5)

## 2016-02-22 LAB — URINALYSIS, ROUTINE W REFLEX MICROSCOPIC
Bilirubin Urine: NEGATIVE
Glucose, UA: NEGATIVE mg/dL
Hgb urine dipstick: NEGATIVE
KETONES UR: NEGATIVE mg/dL
LEUKOCYTES UA: NEGATIVE
NITRITE: NEGATIVE
PH: 7 (ref 5.0–8.0)
PROTEIN: 30 mg/dL — AB
Specific Gravity, Urine: 1.029 (ref 1.005–1.030)

## 2016-02-22 MED ORDER — ONDANSETRON HCL 4 MG/2ML IJ SOLN
2.0000 mg | Freq: Once | INTRAMUSCULAR | Status: AC
  Administered 2016-02-22: 2 mg via INTRAVENOUS
  Filled 2016-02-22: qty 2

## 2016-02-22 MED ORDER — SODIUM CHLORIDE 0.9 % IV SOLN
Freq: Once | INTRAVENOUS | Status: AC
  Administered 2016-02-22: 21:00:00 via INTRAVENOUS

## 2016-02-22 MED ORDER — ONDANSETRON 4 MG PO TBDP: 4.0000 mg | ORAL_TABLET | Freq: Four times a day (QID) | ORAL | Status: DC | PRN

## 2016-02-22 MED ORDER — ONDANSETRON 4 MG PO TBDP
8.0000 mg | ORAL_TABLET | Freq: Once | ORAL | Status: AC
  Administered 2016-02-22: 8 mg via ORAL
  Filled 2016-02-22: qty 2

## 2016-02-22 NOTE — Discharge Instructions (Signed)
Fatigue  Fatigue is feeling tired all of the time, a lack of energy, or a lack of motivation. Occasional or mild fatigue is often a normal response to activity or life in general. However, long-lasting (chronic) or extreme fatigue may indicate an underlying medical condition.  HOME CARE INSTRUCTIONS   Watch your fatigue for any changes. The following actions may help to lessen any discomfort you are feeling:  · Talk to your health care provider about how much sleep you need each night. Try to get the required amount every night.  · Take medicines only as directed by your health care provider.  · Eat a healthy and nutritious diet. Ask your health care provider if you need help changing your diet.  · Drink enough fluid to keep your urine clear or pale yellow.  · Practice ways of relaxing, such as yoga, meditation, massage therapy, or acupuncture.  · Exercise regularly.    · Change situations that cause you stress. Try to keep your work and personal routine reasonable.  · Do not abuse illegal drugs.  · Limit alcohol intake to no more than 1 drink per day for nonpregnant women and 2 drinks per day for men. One drink equals 12 ounces of beer, 5 ounces of wine, or 1½ ounces of hard liquor.  · Take a multivitamin, if directed by your health care provider.  SEEK MEDICAL CARE IF:   · Your fatigue does not get better.  · You have a fever.    · You have unintentional weight loss or gain.  · You have headaches.    · You have difficulty:      Falling asleep.    Sleeping throughout the night.  · You feel angry, guilty, anxious, or sad.     · You are unable to have a bowel movement (constipation).    · You skin is dry.     · Your legs or another part of your body is swollen.    SEEK IMMEDIATE MEDICAL CARE IF:   · You feel confused.    · Your vision is blurry.  · You feel faint or pass out.    · You have a severe headache.    · You have severe abdominal, pelvic, or back pain.    · You have chest pain, shortness of breath, or an  irregular or fast heartbeat.    · You are unable to urinate or you urinate less than normal.    · You develop abnormal bleeding, such as bleeding from the rectum, vagina, nose, lungs, or nipples.  · You vomit blood.     · You have thoughts about harming yourself or committing suicide.    · You are worried that you might harm someone else.       This information is not intended to replace advice given to you by your health care provider. Make sure you discuss any questions you have with your health care provider.     Document Released: 06/01/2007 Document Revised: 08/25/2014 Document Reviewed: 12/06/2013  Elsevier Interactive Patient Education ©2016 [employer] Inc[employer]

## 2016-02-22 NOTE — ED Provider Notes (Signed)
CSN: [id number]     Arrival date & time 02/22/16  1953 History   First MD Initiated Contact with Patient 02/22/16 2009     Chief Complaint  Patient presents with  . Fatigue     (Consider location/radiation/quality/duration/timing/severity/associated sxs/prior Treatment) HPI Comments: 82 y/o male with PMHs of GERD, HLD, HTN, CAD s/p PCI x 2 in 07/2014, and metastatic prostate CA to the bone presents to the ED for evaluation of generalized weakness and fatigue x 2-3 days. He reports constant nausea without emesis associated with waxing and waning episodes of diaphoresis and chills. This has been associated with some mild SOB. He has had a congested, nonproductive cough which he attributes to worsening reflux. He states that he has not had any chest pain or pressure, present with his past NSTEMI. Patient transported by EMS; given 4mg  IV Zofran en route. No other medications taken PTA. Patient denies fever, melena, hematochezia, abdominal pain, headache.  Cardiology - Dr. Sallyanne Kuster   The history is provided by the patient and the spouse. No language interpreter was used.    Past Medical History  Diagnosis Date  . Heart murmur   . GERD (gastroesophageal reflux disease)   . Vertigo   . High cholesterol     hx (08/01/2014)  . Hypertension     hx (08/01/2014)  . Migraine     "stopped ~ age 47" (08/01/2014)  . Arthritis     "left shoulder" (08/01/2014)  . Chronic lower back pain   . Anxiety   . Depression   . Prostate cancer metastatic to bone [facility])     "stage IV" (08/01/2014)  . CAD (coronary artery disease)     s/p cath 08/02/2014 DES x 2 to RCA  . Stented coronary artery 09/18/2014    OM 2 branch of left circumflex to 08/02/2014 with 2 drug-eluting stents (2.5 x 15 mm and 2.25 x 4mm Xience [place]pine)   Past Surgical History  Procedure Laterality Date  . Prostatectomy  2007  . Tonsillectomy    . Pilonidal cyst excision  1960's  . Left heart catheterization with coronary angiogram N/A  08/02/2014    Procedure: LEFT HEART CATHETERIZATION WITH CORONARY ANGIOGRAM;  Surgeon: Burnell Blanks, MD;  Location: [facility] CATH LAB;  Service: Cardiovascular;  Laterality: N/A;  . Percutaneous coronary stent intervention (pci-s)  08/02/2014    Procedure: PERCUTANEOUS CORONARY STENT INTERVENTION (PCI-S);  Surgeon: Burnell Blanks, MD;  Location: [facility] CATH LAB;  Service: Cardiovascular;;   History reviewed. No pertinent family history. Social History  Substance Use Topics  . Smoking status: Light Tobacco Smoker    Types: Cigarettes, Cigars  . Smokeless tobacco: Never Used  . Alcohol Use: Yes     Comment: 08/01/2014 "stopped drinking in ~ 2007; after rehab"    Review of Systems  Constitutional: Positive for chills, diaphoresis and appetite change. Negative for fever.  Respiratory: Positive for cough and shortness of breath.   Cardiovascular: Negative for chest pain.  Gastrointestinal: Positive for nausea and diarrhea (mild). Negative for vomiting, abdominal pain and blood in stool.  Neurological: Positive for weakness (generalized). Negative for syncope.  Ten systems reviewed and are negative for acute change, except as noted in the HPI.    Allergies  Aspirin; Codeine; and Morphine  Home Medications   Prior to Admission medications   Medication Sig Start Date End Date Taking? Authorizing Provider  calcium-vitamin D (OSCAL WITH D) 500-200 MG-UNIT per tablet Take 1 tablet by mouth 2 (two) times daily.  Yes Historical Provider, MD  fluticasone (FLONASE) 50 MCG/ACT nasal spray Place 1 spray into both nostrils daily as needed for allergies.  12/13/15  Yes Historical Provider, MD  gabapentin (NEURONTIN) 300 MG capsule Take 600 mg by mouth 3 (three) times daily.  05/25/14  Yes Historical Provider, MD  HYDROcodone-acetaminophen (NORCO/VICODIN) 5-325 MG tablet Take 1 tablet by mouth every 6 (six) hours as needed for moderate pain.   Yes Historical Provider, MD  leuprolide, 6 Month,  (ELIGARD) 45 MG injection Inject 45 mg into the skin every 6 (six) months.   Yes Historical Provider, MD  LORazepam (ATIVAN) 0.5 MG tablet Take 0.5 mg by mouth 2 (two) times daily as needed for anxiety.    Yes Historical Provider, MD  meclizine (ANTIVERT) 12.5 MG tablet Take 12.5 mg by mouth 3 (three) times daily as needed for dizziness.    Yes Historical Provider, MD  naproxen (NAPROSYN) 500 MG tablet Take 500 mg by mouth daily as needed for mild pain.    Yes Historical Provider, MD  olopatadine (PATANOL) 0.1 % ophthalmic solution Apply 1 drop to eye daily as needed for allergies.  12/03/12  Yes Historical Provider, MD  omeprazole (PRILOSEC) 20 MG capsule Take 20 mg by mouth 2 (two) times daily. 02/13/16  Yes Historical Provider, MD  oxyCODONE (OXY IR/ROXICODONE) 5 MG immediate release tablet Take 5 mg by mouth 2 (two) times daily as needed for moderate pain. Reported on 02/22/2016 01/25/16  Yes Historical Provider, MD  ranitidine (ZANTAC) 300 MG tablet Take 300 mg by mouth at bedtime. 02/12/16  Yes Historical Provider, MD  rosuvastatin (CRESTOR) 40 MG tablet Take 20 mg by mouth at bedtime.  01/21/14  Yes Historical Provider, MD  sertraline (ZOLOFT) 100 MG tablet Take 200 mg by mouth every morning.    Yes Historical Provider, MD  traMADol (ULTRAM) 50 MG tablet Take 100 mg by mouth 2 (two) times daily.  07/24/14  Yes Historical Provider, MD  Vitamin D, Cholecalciferol, 1000 UNITS TABS Take 1,000 Units by mouth daily.   Yes Historical Provider, MD  ZOLEDRONIC ACID IV Inject into the vein. EVERY THREE MONTHS   Yes Historical Provider, MD  ondansetron (ZOFRAN ODT) 4 MG disintegrating tablet Take 1 tablet (4 mg total) by mouth every 6 (six) hours as needed for nausea or vomiting. 02/22/16   Antonietta Breach, PA-C   BP 133/74 mmHg  Pulse 67  Resp 19  SpO2 99%   Physical Exam  Constitutional: He is oriented to person, place, and time. He appears well-developed and well-nourished. No distress.  Patient in NAD;  pleasant.  HENT:  Head: Normocephalic and atraumatic.  Eyes: Conjunctivae and EOM are normal. No scleral icterus.  Neck: Normal range of motion.  Cardiovascular: Regular rhythm and intact distal pulses.   Borderline tachycardia  Pulmonary/Chest: Effort normal. No respiratory distress. He has no wheezes. He has no rales.  Lungs grossly clear b/l. Chest expansion symmetric. No tachypnea or dyspnea.  Abdominal: Soft. He exhibits no distension. There is no tenderness. There is no rebound and no guarding.  Slightly hyperactive bowel sounds. Abdomen soft. No TTP or rigidity.  Musculoskeletal: Normal range of motion.  Neurological: He is alert and oriented to person, place, and time. He exhibits normal muscle tone. Coordination normal.  GCS 15. Speech is goal oriented. Patient moving all extremities.  Skin: Skin is warm and dry. No rash noted. He is not diaphoretic. No erythema. No pallor.  Psychiatric: He has a normal mood and affect. His  behavior is normal.  Nursing note and vitals reviewed.   ED Course  Procedures (including critical care time) Labs Review Labs Reviewed  CBC - Abnormal; Notable for the following:    RDW 15.9 (*)    All other components within normal limits  URINALYSIS, ROUTINE W REFLEX MICROSCOPIC (NOT AT [facility]) - Abnormal; Notable for the following:    Protein, ur 30 (*)    All other components within normal limits  COMPREHENSIVE METABOLIC PANEL - Abnormal; Notable for the following:    Glucose, Bld 139 (*)    BUN 21 (*)    ALT 16 (*)    Alkaline Phosphatase 570 (*)    All other components within normal limits  URINE MICROSCOPIC-ADD ON - Abnormal; Notable for the following:    Squamous Epithelial / LPF 0-5 (*)    Bacteria, UA RARE (*)    Casts HYALINE CASTS (*)    All other components within normal limits  CBG MONITORING, ED - Abnormal; Notable for the following:    Glucose-Capillary 140 (*)    All other components within normal limits  TROPONIN I  TROPONIN I     Imaging Review Dg Chest 2 View  02/22/2016  CLINICAL DATA:  Metastatic prostate cancer. Fatigue. Cold sweats. Shortness of breath. EXAM: CHEST  2 VIEW COMPARISON:  12/17/2015 chest radiograph. FINDINGS: Stable cardiomediastinal silhouette with normal heart size. No pneumothorax. No pleural effusion. Stable chronic bibasilar scarring versus atelectasis. No pulmonary edema. No acute consolidative airspace disease. Patchy sclerotic lesions throughout the thoracic skeleton are not appreciably changed. IMPRESSION: 1. No acute cardiopulmonary disease. 2. Stable chronic bibasilar scarring versus atelectasis . 3. Stable patchy sclerotic osseous metastases throughout the thoracic skeleton. Electronically Signed   By: Ilona Sorrel M.D.   On: 02/22/2016 21:32     I have personally reviewed and evaluated these images and lab results as part of my medical decision-making.   EKG Interpretation   Date/Time:  Friday February 22 2016 19:57:22 EDT Ventricular Rate:  99 PR Interval:    QRS Duration: 98 QT Interval:  412 QTC Calculation: 529 R Axis:   71 Text Interpretation:  Sinus rhythm Borderline prolonged PR interval  Anteroseptal infarct, age indeterminate Prolonged QT interval Baseline  wander in lead(s) II III aVF V3 V5 V6 Since previous tracing QT has  lengthened Confirmed by Canary Brim  MD, MARTHA [phone]) on 02/22/2016 8:54:37 PM      MDM   Final diagnoses:  Other fatigue  Prostate cancer metastatic to bone [facility])    82 year old male presents to the emergency department for feelings of generalized weakness and fatigue with periods of diaphoresis and chills. He has also had some shortness of breath without chest pressure or pain. Patient is currently on hormone therapy for his prostate cancer. He primarily complains of nausea and vomiting while in the emergency department. This has been controlled with Zofran.  Laboratory workup today is reassuring. Patient has no leukocytosis. No electrolyte changes.  Kidney function preserved and UA negative for UTI. Patient does have an elevated alkaline phosphatase, similar to prior evaluation in May 2017. Patient has no c/o abdominal pain. No vomiting. Physical exam without findings concerning for acute surgical abdomen.  Patient with history of NSTEMI for which he required PCI. His cardiac work up today is reassuring. Troponin negative x 2. CXR without mediastinal widening to suggest dissection. No PNA, effusion, or PTX. PE considered, but atypical given symptoms. This is also thought less likely given lack of tachycardia, tachypnea, dyspnea,  or hypoxia. Well's PE score is 1 c/w low risk of PE.  Patient states that he is feeling better since arrival. He expresses that he has appointments scheduled with the [facility] and his oncologist for close outpatient follow-up. Given the patient's reassuring work up, I do not believe further emergent work up is indicated. Patient to be discharged with Zofran. Return precautions discussed and provided. Patient discharged in satisfactory condition. Patient and spouse with no unaddressed concerns.   Filed Vitals:   02/22/16 2215 02/22/16 2245 02/22/16 2345 02/23/16 0001  BP: 146/98 151/85 133/74   Pulse: 65 96 67   Resp:      SpO2: 98% 100% 100% 99%     Antonietta Breach, PA-C 02/23/16 0012  Alfonzo Beers, MD 02/23/16 [id number]

## 2016-02-22 NOTE — ED Notes (Signed)
PA at bedside.

## 2016-02-22 NOTE — ED Notes (Signed)
Patient transported to X-ray 

## 2016-02-22 NOTE — ED Notes (Signed)
Pt arrived to ED via EMS. C/o feeling weak, SOB and diaphoretic when up. Also complains of nausea for 2 days. Hx of cardiac blockage with stent placement, and prostate cancer. Zofran 4mg  received via EMS. EKG shows sinus tach with A-V block. No c/o pain.

## 2016-02-28 ENCOUNTER — Encounter: Payer: Self-pay | Admitting: Cardiovascular Disease

## 2016-11-08 ENCOUNTER — Observation Stay (HOSPITAL_COMMUNITY)
Admission: EM | Admit: 2016-11-08 | Discharge: 2016-11-09 | Disposition: A | Discharge status: Home / Self Care | Payer: Federal, State, Local not specified - PPO | Attending: Internal Medicine | Admitting: Internal Medicine

## 2016-11-08 ENCOUNTER — Emergency Department (HOSPITAL_COMMUNITY): Payer: Federal, State, Local not specified - PPO

## 2016-11-08 ENCOUNTER — Encounter (HOSPITAL_COMMUNITY): Payer: Self-pay | Admitting: Emergency Medicine

## 2016-11-08 DIAGNOSIS — G8929 Other chronic pain: Secondary | ICD-10-CM | POA: Insufficient documentation

## 2016-11-08 DIAGNOSIS — Z923 Personal history of irradiation: Secondary | ICD-10-CM | POA: Diagnosis not present

## 2016-11-08 DIAGNOSIS — E860 Dehydration: Secondary | ICD-10-CM | POA: Diagnosis not present

## 2016-11-08 DIAGNOSIS — K219 Gastro-esophageal reflux disease without esophagitis: Secondary | ICD-10-CM | POA: Diagnosis present

## 2016-11-08 DIAGNOSIS — E872 Acidosis: Secondary | ICD-10-CM | POA: Insufficient documentation

## 2016-11-08 DIAGNOSIS — I4581 Long QT syndrome: Secondary | ICD-10-CM | POA: Diagnosis not present

## 2016-11-08 DIAGNOSIS — C61 Malignant neoplasm of prostate: Secondary | ICD-10-CM | POA: Diagnosis present

## 2016-11-08 DIAGNOSIS — Z955 Presence of coronary angioplasty implant and graft: Secondary | ICD-10-CM | POA: Diagnosis not present

## 2016-11-08 DIAGNOSIS — Z886 Allergy status to analgesic agent status: Secondary | ICD-10-CM | POA: Insufficient documentation

## 2016-11-08 DIAGNOSIS — M19012 Primary osteoarthritis, left shoulder: Secondary | ICD-10-CM | POA: Diagnosis not present

## 2016-11-08 DIAGNOSIS — A4189 Other specified sepsis: Secondary | ICD-10-CM | POA: Diagnosis not present

## 2016-11-08 DIAGNOSIS — I252 Old myocardial infarction: Secondary | ICD-10-CM | POA: Insufficient documentation

## 2016-11-08 DIAGNOSIS — A419 Sepsis, unspecified organism: Secondary | ICD-10-CM | POA: Diagnosis present

## 2016-11-08 DIAGNOSIS — I2510 Atherosclerotic heart disease of native coronary artery without angina pectoris: Secondary | ICD-10-CM | POA: Diagnosis not present

## 2016-11-08 DIAGNOSIS — J101 Influenza due to other identified influenza virus with other respiratory manifestations: Secondary | ICD-10-CM | POA: Diagnosis present

## 2016-11-08 DIAGNOSIS — M545 Low back pain: Secondary | ICD-10-CM | POA: Diagnosis not present

## 2016-11-08 DIAGNOSIS — Z7952 Long term (current) use of systemic steroids: Secondary | ICD-10-CM | POA: Insufficient documentation

## 2016-11-08 DIAGNOSIS — F329 Major depressive disorder, single episode, unspecified: Secondary | ICD-10-CM | POA: Insufficient documentation

## 2016-11-08 DIAGNOSIS — R011 Cardiac murmur, unspecified: Secondary | ICD-10-CM | POA: Insufficient documentation

## 2016-11-08 DIAGNOSIS — E782 Mixed hyperlipidemia: Secondary | ICD-10-CM | POA: Insufficient documentation

## 2016-11-08 DIAGNOSIS — F17210 Nicotine dependence, cigarettes, uncomplicated: Secondary | ICD-10-CM | POA: Diagnosis not present

## 2016-11-08 DIAGNOSIS — C7951 Secondary malignant neoplasm of bone: Secondary | ICD-10-CM | POA: Insufficient documentation

## 2016-11-08 DIAGNOSIS — Z79899 Other long term (current) drug therapy: Secondary | ICD-10-CM | POA: Insufficient documentation

## 2016-11-08 DIAGNOSIS — R9431 Abnormal electrocardiogram [ECG] [EKG]: Secondary | ICD-10-CM | POA: Diagnosis not present

## 2016-11-08 DIAGNOSIS — F419 Anxiety disorder, unspecified: Secondary | ICD-10-CM | POA: Insufficient documentation

## 2016-11-08 DIAGNOSIS — R42 Dizziness and giddiness: Secondary | ICD-10-CM | POA: Insufficient documentation

## 2016-11-08 DIAGNOSIS — I10 Essential (primary) hypertension: Secondary | ICD-10-CM | POA: Insufficient documentation

## 2016-11-08 DIAGNOSIS — E7800 Pure hypercholesterolemia, unspecified: Secondary | ICD-10-CM | POA: Diagnosis not present

## 2016-11-08 DIAGNOSIS — Z833 Family history of diabetes mellitus: Secondary | ICD-10-CM | POA: Insufficient documentation

## 2016-11-08 DIAGNOSIS — M1990 Unspecified osteoarthritis, unspecified site: Secondary | ICD-10-CM | POA: Insufficient documentation

## 2016-11-08 DIAGNOSIS — Z803 Family history of malignant neoplasm of breast: Secondary | ICD-10-CM | POA: Insufficient documentation

## 2016-11-08 DIAGNOSIS — Z8249 Family history of ischemic heart disease and other diseases of the circulatory system: Secondary | ICD-10-CM | POA: Insufficient documentation

## 2016-11-08 LAB — COMPREHENSIVE METABOLIC PANEL
ALT: 17 U/L (ref 17–63)
ANION GAP: 8 (ref 5–15)
AST: 30 U/L (ref 15–41)
Albumin: 3.8 g/dL (ref 3.5–5.0)
Alkaline Phosphatase: 94 U/L (ref 38–126)
BUN: 19 mg/dL (ref 6–20)
CHLORIDE: 103 mmol/L (ref 101–111)
CO2: 23 mmol/L (ref 22–32)
Calcium: 8.5 mg/dL — ABNORMAL LOW (ref 8.9–10.3)
Creatinine, Ser: 0.82 mg/dL (ref 0.61–1.24)
GFR calc Af Amer: >60 mL/min (ref >60)
GFR calc non Af Amer: >60 mL/min (ref >60)
Glucose, Bld: 125 mg/dL — ABNORMAL HIGH (ref 65–99)
Potassium: 3.8 mmol/L (ref 3.5–5.1)
Sodium: 134 mmol/L — ABNORMAL LOW (ref 135–145)
TOTAL PROTEIN: 7 g/dL (ref 6.5–8.1)
Total Bilirubin: 0.5 mg/dL (ref 0.3–1.2)

## 2016-11-08 LAB — CBC WITH DIFFERENTIAL/PLATELET
BASOS ABS: 0 10*3/uL (ref 0.0–0.1)
Basophils Relative: 0 %
EOS PCT: 4 %
Eosinophils Absolute: 0.2 10*3/uL (ref 0.0–0.7)
HEMATOCRIT: 42.2 % (ref 39.0–52.0)
HEMOGLOBIN: 14 g/dL (ref 13.0–17.0)
LYMPHS ABS: 0.6 10*3/uL — AB (ref 0.7–4.0)
Lymphocytes Relative: 10 %
MCH: 28.1 pg (ref 26.0–34.0)
MCHC: 33.2 g/dL (ref 30.0–36.0)
MCV: 84.6 fL (ref 78.0–100.0)
Monocytes Absolute: 0.6 10*3/uL (ref 0.1–1.0)
Monocytes Relative: 9 %
NEUTROS ABS: 4.8 10*3/uL (ref 1.7–7.7)
NEUTROS PCT: 77 %
PLATELETS: 223 10*3/uL (ref 150–400)
RBC: 4.99 MIL/uL (ref 4.22–5.81)
RDW: 14.1 % (ref 11.5–15.5)
WBC: 6.2 10*3/uL (ref 4.0–10.5)

## 2016-11-08 LAB — LACTIC ACID, PLASMA: LACTIC ACID, VENOUS: 1 mmol/L (ref 0.5–1.9)

## 2016-11-08 LAB — I-STAT CG4 LACTIC ACID, ED
LACTIC ACID, VENOUS: 2.39 mmol/L — AB (ref 0.5–1.9)
LACTIC ACID, VENOUS: 1.13 mmol/L (ref 0.5–1.9)

## 2016-11-08 LAB — PROCALCITONIN: Procalcitonin: <0.1 ng/mL

## 2016-11-08 LAB — URINALYSIS, ROUTINE W REFLEX MICROSCOPIC
Bilirubin Urine: NEGATIVE
GLUCOSE, UA: NEGATIVE mg/dL
Hgb urine dipstick: NEGATIVE
Ketones, ur: NEGATIVE mg/dL
LEUKOCYTES UA: NEGATIVE
Nitrite: NEGATIVE
Protein, ur: NEGATIVE mg/dL
SPECIFIC GRAVITY, URINE: 1.012 (ref 1.005–1.030)
pH: 6 (ref 5.0–8.0)

## 2016-11-08 LAB — INFLUENZA PANEL BY PCR (TYPE A & B)
Influenza A By PCR: NEGATIVE
Influenza B By PCR: POSITIVE — AB

## 2016-11-08 MED ORDER — DEXTROSE 5 % IV SOLN
1.0000 g | Freq: Once | INTRAVENOUS | Status: AC
  Administered 2016-11-08: 1 g via INTRAVENOUS
  Filled 2016-11-08: qty 10

## 2016-11-08 MED ORDER — DEXTROSE 5 % IV SOLN
500.0000 mg | Freq: Once | INTRAVENOUS | Status: AC
  Administered 2016-11-08: 500 mg via INTRAVENOUS
  Filled 2016-11-08: qty 500

## 2016-11-08 MED ORDER — IPRATROPIUM BROMIDE 0.02 % IN SOLN
0.5000 mg | Freq: Four times a day (QID) | RESPIRATORY_TRACT | Status: DC
  Administered 2016-11-08: 0.5 mg via RESPIRATORY_TRACT
  Filled 2016-11-08: qty 2.5

## 2016-11-08 MED ORDER — POLYETHYLENE GLYCOL 3350 17 G PO PACK: 17.0000 g | PACK | Freq: Every day | ORAL | Status: DC | PRN

## 2016-11-08 MED ORDER — ONDANSETRON HCL 4 MG/2ML IJ SOLN
4.0000 mg | Freq: Once | INTRAMUSCULAR | Status: AC
  Administered 2016-11-08: 4 mg via INTRAVENOUS
  Filled 2016-11-08: qty 2

## 2016-11-08 MED ORDER — DEXTROSE 5 % IV SOLN
500.0000 mg | INTRAVENOUS | Status: DC
  Filled 2016-11-08: qty 500

## 2016-11-08 MED ORDER — HYDROCODONE-ACETAMINOPHEN 5-325 MG PO TABS
1.0000 | ORAL_TABLET | ORAL | Status: DC | PRN
  Administered 2016-11-08: 2 via ORAL
  Filled 2016-11-08: qty 2

## 2016-11-08 MED ORDER — SODIUM CHLORIDE 0.9 % IV BOLUS (SEPSIS)
500.0000 mL | Freq: Once | INTRAVENOUS | Status: AC
  Administered 2016-11-08: 500 mL via INTRAVENOUS

## 2016-11-08 MED ORDER — ENOXAPARIN SODIUM 40 MG/0.4ML ~~LOC~~ SOLN
40.0000 mg | Freq: Every day | SUBCUTANEOUS | Status: DC
  Administered 2016-11-08: 40 mg via SUBCUTANEOUS
  Filled 2016-11-08: qty 0.4

## 2016-11-08 MED ORDER — OSELTAMIVIR PHOSPHATE 75 MG PO CAPS
75.0000 mg | ORAL_CAPSULE | Freq: Two times a day (BID) | ORAL | Status: DC
  Administered 2016-11-09 (×2): 75 mg via ORAL
  Filled 2016-11-08 (×2): qty 1

## 2016-11-08 MED ORDER — FENTANYL 12 MCG/HR TD PT72
12.5000 ug | MEDICATED_PATCH | TRANSDERMAL | Status: DC
  Administered 2016-11-08: 12.5 ug via TRANSDERMAL
  Filled 2016-11-08: qty 1

## 2016-11-08 MED ORDER — ACETAMINOPHEN 325 MG PO TABS: 650.0000 mg | ORAL_TABLET | Freq: Four times a day (QID) | ORAL | Status: DC | PRN

## 2016-11-08 MED ORDER — LEVALBUTEROL HCL 0.63 MG/3ML IN NEBU: 0.6300 mg | INHALATION_SOLUTION | Freq: Four times a day (QID) | RESPIRATORY_TRACT | Status: DC

## 2016-11-08 MED ORDER — IPRATROPIUM BROMIDE 0.02 % IN SOLN
0.5000 mg | Freq: Three times a day (TID) | RESPIRATORY_TRACT | Status: DC
  Administered 2016-11-09: 0.5 mg via RESPIRATORY_TRACT
  Filled 2016-11-08 (×2): qty 2.5

## 2016-11-08 MED ORDER — SODIUM CHLORIDE 0.9 % IV SOLN
INTRAVENOUS | Status: AC
  Administered 2016-11-08 – 2016-11-09 (×2): via INTRAVENOUS

## 2016-11-08 MED ORDER — LEVALBUTEROL HCL 0.63 MG/3ML IN NEBU
0.6300 mg | INHALATION_SOLUTION | Freq: Three times a day (TID) | RESPIRATORY_TRACT | Status: DC
  Administered 2016-11-09: 0.63 mg via RESPIRATORY_TRACT
  Filled 2016-11-08 (×2): qty 3

## 2016-11-08 MED ORDER — ROSUVASTATIN CALCIUM 20 MG PO TABS
20.0000 mg | ORAL_TABLET | Freq: Every day | ORAL | Status: DC
  Administered 2016-11-08: 20 mg via ORAL
  Filled 2016-11-08: qty 1

## 2016-11-08 MED ORDER — BISACODYL 10 MG RE SUPP
10.0000 mg | Freq: Every day | RECTAL | Status: DC | PRN
Start: 2016-11-08 — End: 2016-11-09

## 2016-11-08 MED ORDER — SODIUM CHLORIDE 0.9% FLUSH
3.0000 mL | Freq: Two times a day (BID) | INTRAVENOUS | Status: DC
  Administered 2016-11-08: 3 mL via INTRAVENOUS

## 2016-11-08 MED ORDER — SENNA 8.6 MG PO TABS
1.0000 | ORAL_TABLET | Freq: Two times a day (BID) | ORAL | Status: DC
  Administered 2016-11-08 – 2016-11-09 (×2): 8.6 mg via ORAL
  Filled 2016-11-08 (×2): qty 1

## 2016-11-08 MED ORDER — PREDNISONE 20 MG PO TABS
10.0000 mg | ORAL_TABLET | Freq: Every day | ORAL | Status: DC
  Administered 2016-11-08 – 2016-11-09 (×2): 10 mg via ORAL
  Filled 2016-11-08 (×2): qty 1

## 2016-11-08 MED ORDER — DEXTROSE 5 % IV SOLN
1.0000 g | INTRAVENOUS | Status: DC
  Filled 2016-11-08: qty 10

## 2016-11-08 MED ORDER — SODIUM CHLORIDE 0.9 % IV BOLUS (SEPSIS)
1000.0000 mL | Freq: Once | INTRAVENOUS | Status: AC
  Administered 2016-11-08: 1000 mL via INTRAVENOUS

## 2016-11-08 MED ORDER — LEVALBUTEROL HCL 0.63 MG/3ML IN NEBU
0.6300 mg | INHALATION_SOLUTION | Freq: Four times a day (QID) | RESPIRATORY_TRACT | Status: DC | PRN
  Administered 2016-11-08: 0.63 mg via RESPIRATORY_TRACT
  Filled 2016-11-08: qty 3

## 2016-11-08 MED ORDER — GUAIFENESIN ER 600 MG PO TB12
600.0000 mg | ORAL_TABLET | Freq: Two times a day (BID) | ORAL | Status: DC
  Administered 2016-11-08 – 2016-11-09 (×2): 600 mg via ORAL
  Filled 2016-11-08 (×2): qty 1

## 2016-11-08 MED ORDER — ACETAMINOPHEN 650 MG RE SUPP: 650.0000 mg | Freq: Four times a day (QID) | RECTAL | Status: DC | PRN

## 2016-11-08 MED ORDER — ENSURE ENLIVE PO LIQD
237.0000 mL | Freq: Two times a day (BID) | ORAL | Status: DC
  Administered 2016-11-09: 237 mL via ORAL

## 2016-11-08 MED ORDER — ACETAMINOPHEN 325 MG PO TABS
650.0000 mg | ORAL_TABLET | Freq: Once | ORAL | Status: AC
  Administered 2016-11-08: 650 mg via ORAL
  Filled 2016-11-08: qty 2

## 2016-11-08 MED ORDER — LORAZEPAM 0.5 MG PO TABS
0.5000 mg | ORAL_TABLET | Freq: Two times a day (BID) | ORAL | Status: DC | PRN
  Administered 2016-11-08: 0.5 mg via ORAL
  Filled 2016-11-08: qty 1

## 2016-11-08 MED ORDER — OSELTAMIVIR PHOSPHATE 75 MG PO CAPS
75.0000 mg | ORAL_CAPSULE | Freq: Once | ORAL | Status: AC
  Administered 2016-11-08: 75 mg via ORAL
  Filled 2016-11-08: qty 1

## 2016-11-08 MED ORDER — GABAPENTIN 300 MG PO CAPS
600.0000 mg | ORAL_CAPSULE | Freq: Three times a day (TID) | ORAL | Status: DC
  Administered 2016-11-08 – 2016-11-09 (×2): 600 mg via ORAL
  Filled 2016-11-08 (×2): qty 2

## 2016-11-08 NOTE — Progress Notes (Signed)
Pharmacy Antibiotic Note  Frank Petersen is a 83 y.o. male with cough, flu-like symptoms admitted on 11/08/2016 with pneumonia.  Pharmacy has been consulted for rocephin/zmax dosing.  Plan: Rocephin 1 Gm IV q24h Zmax 500 mg IV q24h Rx will sign off- as no further adjustments are needed  Height: 6\' 2"  (188 cm) Weight: 174 lb 2.6 oz (79 kg) IBW/kg (Calculated) : 82.2  Temp (24hrs), Avg:99.4 F (37.4 C), Min:98.4 F (36.9 C), Max:101.4 F (38.6 C)   Recent Labs Lab 11/08/16 1606 11/08/16 1613 11/08/16 1937  WBC 6.2  --   --   CREATININE 0.82  --   --   LATICACIDVEN  --  2.39* 1.13    Estimated Creatinine Clearance: 78.9 mL/min (by C-G formula based on SCr of 0.82 mg/dL).    Allergies  Allergen Reactions  . Aspirin Other (See Comments)    GI Upset   . Codeine Nausea Only  . Morphine Itching    Antimicrobials this admission: 3/24 rocephin >>  3/24 zmax >>   Dose adjustments this admission:   Microbiology results:  BCx:   UCx:    Sputum:    MRSA PCR:   Thank you for allowing pharmacy to be a part of this patient's care.  Dorrene German 11/08/2016 9:21 PM

## 2016-11-08 NOTE — ED Notes (Signed)
Pt tried to void for urine specimen but was unable to give sufficient amount for specimen. Urinal at bedside and pt will try again when able.

## 2016-11-08 NOTE — ED Provider Notes (Signed)
Medical screening examination/treatment/procedure(s) were conducted as a shared visit with non-physician practitioner(s) and myself.  I personally evaluated the patient during the encounter.   EKG Interpretation  Date/Time:  Saturday November 08 2016 16:23:25 EDT Ventricular Rate:  100 PR Interval:    QRS Duration: 94 QT Interval:  408 QTC Calculation: 527 R Axis:   33 Text Interpretation:  Sinus tachycardia with irregular rate Anteroseptal infarct, old Nonspecific repol abnormality, diffuse leads Prolonged QT interval Confirmed by Eleora Sutherland MD, Rihana Kiddy [phone]) on 11/08/2016 4:36:47 PM      A 83 year old male who presents with fever and cough. History of metastatic prostate cancer to the bone on oral chemotherapy and prednisone. States progressively worsening onset of cough, congestion, fevers and chills, fatigue, shortness of breath over the past 12 days. Initially was seen at urgent care, with chest x-ray and told he may have had a "touch" of bronchopneumonia. Has been taking doxycycline, without improvement.  Is febrile to 101.4 Fahrenheit, borderline tachycardic but normotensive and in no respiratory distress. During fatigued overall. Otherwise exam nonfocal. Chest x-ray visualized and reviewed with radiology. No evidence of acute cardio pulmonary processes or pneumonia. Did receive ceftriaxone prior to results. Influenza is positive, and will start Tamiflu. There is mildly elevated lactate of 2.39, no leukocytosis or other end organ damage. Given immune suppression and worsening symptoms will plan to admit to hospitalist service.     Forde Dandy, MD 11/08/16 [phone]

## 2016-11-08 NOTE — ED Triage Notes (Signed)
Pt complaint of continued fever, cough, and generalized body aches for a week post treatment for URI.

## 2016-11-08 NOTE — H&P (Addendum)
Frank Petersen RAQ:[id number] DOB: February 18, 1933 DOA: 11/08/2016     PCP: [facility]   Outpatient Specialists: Cards: Frank Petersen Patient coming from: home Lives   With Wife    Chief Complaint: cough flu-like symptoms  HPI: Frank Petersen is a 83 y.o. male with medical history significant of prostate cancer, CAD, arthritis,GERD, HLD, HTN,     Presented with fever cough generalized body aches worse for the past 2 days. Even prior to this 2 weeks ago patient have had some coughing sneezing and nasal congestion he was seen at that time an urgent care and was told he had a touch of pneumonia and treated with doxycycline did not seem to help. Reports exertional shortness of breath.His wife have had similar symptoms, He have had nausea.  No chest pain, urinary complaints syncope or rashes  Wife reports decreased by mouth intake he has been walking male walker but has been more unstable than usual and generalized fatigue and weakness  Regarding pertinent Chronic problems: History of prostatitis cancer with metastases to the bone since 2015 status post radiation therapy History of coronary artery disease last drug-eluting stents were placed in 2015 Patient used to be on metoprolol after stent placement his blood pressure has been running low and this been discontinued Wife states that he used to be diabetic years ago but also has resolved after losing weight Patient is on chronic steroids as part of his cancer treatment IN ER:  Temp (24hrs), Avg:100.3 F (37.9 C), Min:99.1 F (37.3 C), Max:101.4 F (38.6 C)      RR 19 HR 89 BP 128/76 Lactic acid 2.39 Influenza B positive  NA 134 Cr 0.82 WBC 6.2 CXR non-acute Following Medications were ordered in ER: Medications  azithromycin (ZITHROMAX) 500 mg in dextrose 5 % 250 mL IVPB (500 mg Intravenous New Bag/Given 11/08/16 1759)  sodium chloride 0.9 % bolus 1,000 mL (0 mLs Intravenous Stopped 11/08/16 1704)    And  sodium  chloride 0.9 % bolus 1,000 mL (0 mLs Intravenous Stopped 11/08/16 1813)    And  sodium chloride 0.9 % bolus 500 mL (500 mLs Intravenous New Bag/Given 11/08/16 1803)  acetaminophen (TYLENOL) tablet 650 mg (650 mg Oral Given 11/08/16 1705)  ondansetron (ZOFRAN) injection 4 mg (4 mg Intravenous Given 11/08/16 1705)  cefTRIAXone (ROCEPHIN) 1 g in dextrose 5 % 50 mL IVPB (0 g Intravenous Stopped 11/08/16 1759)  oseltamivir (TAMIFLU) capsule 75 mg (75 mg Oral Given 11/08/16 1757)      Hospitalist was called for admission for Sepsis secondary to influenza B  Review of Systems:    Pertinent positives include:  Fevers, chills, fatigue, shortness of breath at rest  dyspnea on exertion, excess mucus, productive cough, Constitutional:  No weight loss, night sweats,weight loss  HEENT:  No headaches, Difficulty swallowing,Tooth/dental problems,Sore throat,  No sneezing, itching, ear ache, nasal congestion, post nasal drip,  Cardio-vascular:  No chest pain, Orthopnea, PND, anasarca, dizziness, palpitations.no Bilateral lower extremity swelling  GI:  No heartburn, indigestion, abdominal pain, nausea, vomiting, diarrhea, change in bowel habits, loss of appetite, melena, blood in stool, hematemesis Resp:    No non-productive cough, No coughing up of blood.No change in color of mucus.No wheezing. Skin:  no rash or lesions. No jaundice GU:  no dysuria, change in color of urine, no urgency or frequency. No straining to urinate.  No flank pain.  Musculoskeletal:  No joint pain or no joint swelling. No decreased range of motion. No back pain.  Psych:  No change in mood or affect. No depression or anxiety. No memory loss.  Neuro: no localizing neurological complaints, no tingling, no weakness, no double vision, no gait abnormality, no slurred speech, no confusion  As per HPI otherwise 10 point review of systems negative.   Past Medical History: Past Medical History:  Diagnosis Date  . Anxiety   .  Arthritis    "left shoulder" (08/01/2014)  . CAD (coronary artery disease)    s/p cath 08/02/2014 DES x 2 to RCA  . Chronic lower back pain   . Depression   . GERD (gastroesophageal reflux disease)   . Heart murmur   . High cholesterol    hx (08/01/2014)  . Hypertension    hx (08/01/2014)  . Migraine    "stopped ~ age 66" (08/01/2014)  . Prostate cancer metastatic to bone [facility])    "stage IV" (08/01/2014)  . Stented coronary artery 09/18/2014   OM 2 branch of left circumflex to 08/02/2014 with 2 drug-eluting stents (2.5 x 15 mm and 2.25 x 88mm Xience [place])  . Vertigo    Past Surgical History:  Procedure Laterality Date  . LEFT HEART CATHETERIZATION WITH CORONARY ANGIOGRAM N/A 08/02/2014   Procedure: LEFT HEART CATHETERIZATION WITH CORONARY ANGIOGRAM;  Surgeon: Frank Blanks, MD;  Location: [facility] CATH LAB;  Service: Cardiovascular;  Laterality: N/A;  . PERCUTANEOUS CORONARY STENT INTERVENTION (PCI-S)  08/02/2014   Procedure: PERCUTANEOUS CORONARY STENT INTERVENTION (PCI-S);  Surgeon: Frank Blanks, MD;  Location: [facility] CATH LAB;  Service: Cardiovascular;;  . PILONIDAL CYST EXCISION  1960's  . PROSTATECTOMY  2007  . TONSILLECTOMY       Social History:  Ambulatory  Walker or cane    reports that he has been smoking Cigarettes and Cigars.  He has never used smokeless tobacco. He reports that he drinks alcohol. He reports that he does not use drugs.  Allergies:   Allergies  Allergen Reactions  . Aspirin Other (See Comments)    GI Upset   . Codeine Nausea Only  . Morphine Itching       Family History:   Family History  Problem Relation Age of Onset  . Diabetes Mother   . CAD Father   . Diabetes Sister   . Breast cancer Sister   . CAD Brother     Medications: Prior to Admission medications   Medication Sig Start Date End Date Taking? Authorizing Provider  calcium-vitamin D (OSCAL WITH D) 500-200 MG-UNIT per tablet Take 1 tablet by mouth 2 (two)  times daily.   Yes Historical Provider, MD  doxycycline (VIBRA-TABS) 100 MG tablet Take 100 mg by mouth 2 (two) times daily. 10 day course started 3/18 11/02/16  Yes Historical Provider, MD  fentaNYL (DURAGESIC - DOSED MCG/HR) 12 MCG/HR Place 1 patch onto the skin every 3 (three) days. 10/28/16  Yes Historical Provider, MD  fluticasone (FLONASE) 50 MCG/ACT nasal spray Place 1 spray into both nostrils daily as needed for allergies.  12/13/15  Yes Historical Provider, MD  gabapentin (NEURONTIN) 300 MG capsule Take 600 mg by mouth 3 (three) times daily.  05/25/14  Yes Historical Provider, MD  hydroxypropyl methylcellulose / hypromellose (ISOPTO TEARS / GONIOVISC) 2.5 % ophthalmic solution Place 2 drops into both eyes 3 (three) times daily as needed for dry eyes.   Yes Historical Provider, MD  leuprolide, 6 Month, (ELIGARD) 45 MG injection Inject 45 mg into the skin every 6 (six) months.   Yes Historical Provider, MD  LORazepam (ATIVAN) 0.5 MG tablet Take 0.5 mg by mouth 2 (two) times daily as needed for anxiety.    Yes Historical Provider, MD  meclizine (ANTIVERT) 12.5 MG tablet Take 12.5 mg by mouth 3 (three) times daily as needed for dizziness.    Yes Historical Provider, MD  naproxen (NAPROSYN) 500 MG tablet Take 500 mg by mouth daily as needed for mild pain.    Yes Historical Provider, MD  ondansetron (ZOFRAN ODT) 4 MG disintegrating tablet Take 1 tablet (4 mg total) by mouth every 6 (six) hours as needed for nausea or vomiting. 02/22/16  Yes Antonietta Breach, PA-C  predniSONE (DELTASONE) 5 MG tablet Take 5 mg by mouth daily. 09/08/16  Yes Historical Provider, MD  rosuvastatin (CRESTOR) 40 MG tablet Take 20 mg by mouth at bedtime.  01/21/14  Yes Historical Provider, MD  sertraline (ZOLOFT) 100 MG tablet Take 200 mg by mouth every morning.    Yes Historical Provider, MD  traMADol (ULTRAM) 50 MG tablet Take 100 mg by mouth 2 (two) times daily.  07/24/14  Yes Historical Provider, MD  Vitamin D, Cholecalciferol, 1000  UNITS TABS Take 1,000 Units by mouth daily.   Yes Historical Provider, MD  ZOLEDRONIC ACID IV Inject into the vein. EVERY THREE MONTHS   Yes Historical Provider, MD  ZYTIGA 250 MG tablet Take 1,000 mg by mouth daily. 10/09/16  Yes Historical Provider, MD  ranitidine (ZANTAC) 300 MG tablet Take 300 mg by mouth at bedtime. 02/12/16   Historical Provider, MD    Physical Exam: Patient Vitals for the past 24 hrs:  BP Temp Temp src Pulse Resp SpO2 Weight  11/08/16 1814 128/76 - - 89 19 96 % -  11/08/16 1617 - (!) 101.4 F (38.6 C) Rectal - - - -  11/08/16 1540 117/75 99.1 F (37.3 C) Oral 92 20 100 % 79.8 kg (176 lb)    1. General:  in No Acute distress 2. Psychological: Alert and   Oriented 3. Head/ENT:    Dry Mucous Membranes                          Head Non traumatic, neck supple                         Poor Dentition 4. SKIN:   decreased Skin turgor,  Skin clean Dry and intact no rash 5. Heart: Regular rate and rhythm no Murmur, Rub or gallop 6. Lungs: no wheezes mild  crackles   7. Abdomen: Soft,  non-tender, Non distended 8. Lower extremities: no clubbing, cyanosis, or edema 9. Neurologically Grossly intact, moving all 4 extremities equally   10. MSK: Normal range of motion   body mass index is 23.22 kg/m.  Labs on Admission:   Labs on Admission: I have personally reviewed following labs and imaging studies  CBC:  Recent Labs Lab 11/08/16 1606  WBC 6.2  NEUTROABS 4.8  HGB 14.0  HCT 42.2  MCV 84.6  PLT 417   Basic Metabolic Panel:  Recent Labs Lab 11/08/16 1606  NA 134*  K 3.8  CL 103  CO2 23  GLUCOSE 125*  BUN 19  CREATININE 0.82  CALCIUM 8.5*   GFR: CrCl cannot be calculated (Unknown ideal weight.). Liver Function Tests:  Recent Labs Lab 11/08/16 1606  AST 30  ALT 17  ALKPHOS 94  BILITOT 0.5  PROT 7.0  ALBUMIN 3.8   No results for  input(s): LIPASE, AMYLASE in the last 168 hours. No results for input(s): AMMONIA in the last 168  hours. Coagulation Profile: No results for input(s): INR, PROTIME in the last 168 hours. Cardiac Enzymes: No results for input(s): CKTOTAL, CKMB, CKMBINDEX, TROPONINI in the last 168 hours. BNP (last 3 results) No results for input(s): PROBNP in the last 8760 hours. HbA1C: No results for input(s): HGBA1C in the last 72 hours. CBG: No results for input(s): GLUCAP in the last 168 hours. Lipid Profile: No results for input(s): CHOL, HDL, LDLCALC, TRIG, CHOLHDL, LDLDIRECT in the last 72 hours. Thyroid Function Tests: No results for input(s): TSH, T4TOTAL, FREET4, T3FREE, THYROIDAB in the last 72 hours. Anemia Panel: No results for input(s): VITAMINB12, FOLATE, FERRITIN, TIBC, IRON, RETICCTPCT in the last 72 hours. Urine analysis:    Component Value Date/Time   COLORURINE YELLOW 11/08/2016 1800   APPEARANCEUR CLEAR 11/08/2016 1800   LABSPEC 1.012 11/08/2016 1800   PHURINE 6.0 11/08/2016 1800   GLUCOSEU NEGATIVE 11/08/2016 1800   HGBUR NEGATIVE 11/08/2016 1800   BILIRUBINUR NEGATIVE 11/08/2016 1800   KETONESUR NEGATIVE 11/08/2016 1800   PROTEINUR NEGATIVE 11/08/2016 1800   UROBILINOGEN 0.2 08/01/2014 1125   NITRITE NEGATIVE 11/08/2016 1800   LEUKOCYTESUR NEGATIVE 11/08/2016 1800   Sepsis Labs: @LABRCNTIP (procalcitonin:4,lacticidven:4) )No results found for this or any previous visit (from the past 240 hour(s)).    UA  no evidence of UTI    Lab Results  Component Value Date   HGBA1C 6.4 (H) 08/02/2014    CrCl cannot be calculated (Unknown ideal weight.).  BNP (last 3 results) No results for input(s): PROBNP in the last 8760 hours.   ECG REPORT  Independently reviewed Rate: 100  Rhythm: sinus tachy ST&T Change:Repol abnormality diffuse leads QTC 527  Filed Weights   11/08/16 1540  Weight: 79.8 kg (176 lb)     Cultures: No results found for: SDES, SPECREQUEST, CULT, REPTSTATUS   Radiological Exams on Admission: Dg Chest 2 View  Result Date:  11/08/2016 CLINICAL DATA:  Cough, nausea, generalized weakness for 2 weeks EXAM: CHEST  2 VIEW COMPARISON:  02/22/2016 FINDINGS: Cardiomediastinal silhouette is stable. No infiltrate or pulmonary edema. Degenerative changes bilateral shoulders again noted. Again noted diffuse sclerotic bone metastases with progression from prior exam. IMPRESSION: No infiltrate or pulmonary edema. Again noted diffuse sclerotic bone metastases with progression from prior exam. Electronically Signed   By: Lahoma Crocker M.D.   On: 11/08/2016 17:08    Chart has been reviewed    Assessment/Plan  83 y.o. male with medical history significant of prostate cancer, CAD, arthritis,GERD, HLD, HTN admit with Sepsis due to influenza B  Present on Admission: . Sepsis ([place]) most likely secondary to influenza B await results of blood culture for now continue antibiotics started in ED until blood cultures have resulted . Influenza B initiate Tamiflu droplet percussions . CAD (coronary artery disease) stable continue statin patient is allergic to aspirin soft blood pressures does not tolerate metoprolol . GERD (gastroesophageal reflux disease) stable chronic . Prostate cancer ([place]) - chronic hold chemotherapy while hospitalized, with patient is on chronic steroids given the patient is ill will increase the dose to avoid adrenal insufficiency  . Dehydration rehydrate and check orthostatics . Prolonged QT interval - - will monitor on tele avoid QT prolonging medications, rehydrate correct electrolytes    Other plan as per orders.  DVT prophylaxis:   Lovenox     Code Status:  FULL CODE  as per patient    Family  Communication:   Family   at  Bedside  plan of care was discussed with Wife   Disposition Plan:     To home once workup is complete and patient is stable                         Would benefit from PT/OT eval prior to DC  ordered                                                Consults called: none  Admission  status:    obs anticipate recovery within the next 24-48 hours   Level of care       tele     I have spent a total of 56 min on this admission  Shacola Schussler 11/08/2016, 7:24 PM    [facility]  Pager [phone]   after 2 AM please page floor coverage PA If 7AM-7PM, please contact the day team taking care of the patient  [email or web address]  Password [email or web address]

## 2016-11-08 NOTE — ED Notes (Signed)
Abnormal lab result MD Kathrynn Humble, [place] Gwyndolyn Saxon and RN Octavia Bruckner have been made aware

## 2016-11-08 NOTE — ED Provider Notes (Signed)
[facility] DEPT Provider Note   CSN: [id number] Arrival date & time: 11/08/16  1537     History   Chief Complaint Chief Complaint  Patient presents with  . Fever    HPI Frank Petersen is a 83 y.o. male.  Frank Petersen is a 83 y.o. Male who presents to the ED with his wife complaining of flu like symptoms for the past 12 days, that have worsened over the past two days. Patient reports for almost 2 weeks patient has had cough, sneezing, nasal congestion, and body aches. He reports over the past 2 days he's had worsening body aches, chills, subjective fever and cough. He was seen by an urgent care about 10 days ago and was told he had "a touch of the pneumonia." He was started on doxycycline. He reports no improvement since starting doxycycline, and in fact over the last 2 days he's felt worse. He does report some shortness of breath with exertion. He reports productive cough. He has a history of prostate cancer with mets to his bone.  He denies urinary symptoms, syncope, chest pain, sore throat, trouble swallowing, rashes, vomiting, or diarrhea.    The history is provided by the patient, medical records and the spouse. No language interpreter was used.  Fever   Associated symptoms include congestion and cough. Pertinent negatives include no chest pain, no diarrhea, no vomiting, no headaches and no sore throat.    Past Medical History:  Diagnosis Date  . Anxiety   . Arthritis    "left shoulder" (08/01/2014)  . CAD (coronary artery disease)    s/p cath 08/02/2014 DES x 2 to RCA  . Chronic lower back pain   . Depression   . GERD (gastroesophageal reflux disease)   . Heart murmur   . High cholesterol    hx (08/01/2014)  . Hypertension    hx (08/01/2014)  . Migraine    "stopped ~ age 37" (08/01/2014)  . Prostate cancer metastatic to bone [facility])    "stage IV" (08/01/2014)  . Stented coronary artery 09/18/2014   OM 2 branch of left circumflex to 08/02/2014 with 2  drug-eluting stents (2.5 x 15 mm and 2.25 x 62mm Xience [place])  . Vertigo     Patient Active Problem List   Diagnosis Date Noted  . Sepsis ([place]) 11/08/2016  . Influenza B 11/08/2016  . Dehydration 11/08/2016  . Prolonged QT interval 11/08/2016  . Mixed hyperlipidemia 09/18/2014  . CAD (coronary artery disease) 09/18/2014  . Stented coronary artery 09/18/2014  . Depression 09/18/2014  . NSTEMI (non-ST elevated myocardial infarction) ([place]) 08/02/2014  . Prostate cancer ([place])   . Heart murmur   . GERD (gastroesophageal reflux disease)   . Arthritis   . Vertigo     Past Surgical History:  Procedure Laterality Date  . LEFT HEART CATHETERIZATION WITH CORONARY ANGIOGRAM N/A 08/02/2014   Procedure: LEFT HEART CATHETERIZATION WITH CORONARY ANGIOGRAM;  Surgeon: Burnell Blanks, MD;  Location: [facility] CATH LAB;  Service: Cardiovascular;  Laterality: N/A;  . PERCUTANEOUS CORONARY STENT INTERVENTION (PCI-S)  08/02/2014   Procedure: PERCUTANEOUS CORONARY STENT INTERVENTION (PCI-S);  Surgeon: Burnell Blanks, MD;  Location[facility] CATH LAB;  Service: Cardiovascular;;  . PILONIDAL CYST EXCISION  1960's  . PROSTATECTOMY  2007  . TONSILLECTOMY         Home Medications    Prior to Admission medications   Medication Sig Start Date End Date Taking? Authorizing Provider  calcium-vitamin D (OSCAL WITH D) 500-200 MG-UNIT  per tablet Take 1 tablet by mouth 2 (two) times daily.   Yes Historical Provider, MD  doxycycline (VIBRA-TABS) 100 MG tablet Take 100 mg by mouth 2 (two) times daily. 10 day course started 3/18 11/02/16  Yes Historical Provider, MD  fentaNYL (DURAGESIC - DOSED MCG/HR) 12 MCG/HR Place 12.5 mcg onto the skin every 3 (three) days.  10/28/16  Yes Historical Provider, MD  fluticasone (FLONASE) 50 MCG/ACT nasal spray Place 1 spray into both nostrils daily as needed for allergies.  12/13/15  Yes Historical Provider, MD  gabapentin (NEURONTIN) 300 MG capsule Take 600 mg by mouth 3  (three) times daily.  05/25/14  Yes Historical Provider, MD  hydroxypropyl methylcellulose / hypromellose (ISOPTO TEARS / GONIOVISC) 2.5 % ophthalmic solution Place 2 drops into both eyes 3 (three) times daily as needed for dry eyes.   Yes Historical Provider, MD  leuprolide, 6 Month, (ELIGARD) 45 MG injection Inject 45 mg into the skin every 6 (six) months.   Yes Historical Provider, MD  LORazepam (ATIVAN) 0.5 MG tablet Take 0.5 mg by mouth 2 (two) times daily as needed for anxiety.    Yes Historical Provider, MD  meclizine (ANTIVERT) 12.5 MG tablet Take 12.5 mg by mouth 3 (three) times daily as needed for dizziness.    Yes Historical Provider, MD  naproxen (NAPROSYN) 500 MG tablet Take 500 mg by mouth daily as needed for mild pain.    Yes Historical Provider, MD  ondansetron (ZOFRAN ODT) 4 MG disintegrating tablet Take 1 tablet (4 mg total) by mouth every 6 (six) hours as needed for nausea or vomiting. 02/22/16  Yes Antonietta Breach, PA-C  predniSONE (DELTASONE) 5 MG tablet Take 5 mg by mouth daily. 09/08/16  Yes Historical Provider, MD  rosuvastatin (CRESTOR) 40 MG tablet Take 20 mg by mouth at bedtime.  01/21/14  Yes Historical Provider, MD  sertraline (ZOLOFT) 100 MG tablet Take 200 mg by mouth every morning.    Yes Historical Provider, MD  traMADol (ULTRAM) 50 MG tablet Take 100 mg by mouth 2 (two) times daily.  07/24/14  Yes Historical Provider, MD  Vitamin D, Cholecalciferol, 1000 UNITS TABS Take 1,000 Units by mouth daily.   Yes Historical Provider, MD  ZOLEDRONIC ACID IV Inject into the vein. EVERY THREE MONTHS   Yes Historical Provider, MD  ZYTIGA 250 MG tablet Take 1,000 mg by mouth daily. 10/09/16  Yes Historical Provider, MD  ranitidine (ZANTAC) 300 MG tablet Take 300 mg by mouth at bedtime. 02/12/16   Historical Provider, MD    Family History Family History  Problem Relation Age of Onset  . Diabetes Mother   . CAD Father   . Diabetes Sister   . Breast cancer Sister   . CAD Brother      Social History Social History  Substance Use Topics  . Smoking status: Former Smoker    Types: Cigarettes, Cigars  . Smokeless tobacco: Never Used  . Alcohol use Yes     Comment: 08/01/2014 "stopped drinking in ~ 2007; after rehab"     Allergies   Aspirin; Codeine; and Morphine   Review of Systems Review of Systems  Constitutional: Positive for appetite change, chills, fatigue and fever.  HENT: Positive for congestion and rhinorrhea. Negative for sore throat and trouble swallowing.   Eyes: Negative for visual disturbance.  Respiratory: Positive for cough and shortness of breath. Negative for wheezing.   Cardiovascular: Negative for chest pain and palpitations.  Gastrointestinal: Negative for abdominal pain, diarrhea, nausea  and vomiting.  Genitourinary: Negative for difficulty urinating, dysuria and frequency.  Musculoskeletal: Positive for myalgias. Negative for back pain and neck pain.  Skin: Negative for rash.  Neurological: Negative for syncope, light-headedness and headaches.     Physical Exam Updated Vital Signs BP 104/61 (BP Location: Left Arm)   Pulse 76   Temp (!) 101.4 F (38.6 C) (Rectal)   Resp 20   Wt 79.8 kg   SpO2 97%   BMI 23.22 kg/m   Physical Exam  Constitutional: He is oriented to person, place, and time. He appears well-developed and well-nourished. No distress.  Nontoxic appearing.  HENT:  Head: Normocephalic and atraumatic.  Right Ear: External ear normal.  Left Ear: External ear normal.  Mouth/Throat: Oropharynx is clear and moist.  Eyes: Conjunctivae are normal. Pupils are equal, round, and reactive to light. Right eye exhibits no discharge. Left eye exhibits no discharge.  Neck: Neck supple. No JVD present.  Cardiovascular: Normal rate, regular rhythm, normal heart sounds and intact distal pulses.  Exam reveals no gallop and no friction rub.   No murmur heard. Pulmonary/Chest: Effort normal. No stridor. No respiratory distress. He  has no wheezes. He has no rales.  Crackles and diminished lung sounds bilaterally. No increased work of breathing. No rales or rhonchi.  Abdominal: Soft. He exhibits no mass. There is no tenderness. There is no guarding.  Abdomen is soft and nontender to palpation.  Musculoskeletal: He exhibits no edema or tenderness.  No lower extremity edema or tenderness.  Lymphadenopathy:    He has no cervical adenopathy.  Neurological: He is alert and oriented to person, place, and time. Coordination normal.  Hard of hearing.  Skin: Skin is warm and dry. Capillary refill takes less than 2 seconds. No rash noted. He is not diaphoretic. No erythema. No pallor.  Psychiatric: He has a normal mood and affect. His behavior is normal.  Nursing note and vitals reviewed.    ED Treatments / Results  Labs (all labs ordered are listed, but only abnormal results are displayed) Labs Reviewed  COMPREHENSIVE METABOLIC PANEL - Abnormal; Notable for the following:       Result Value   Sodium 134 (*)    Glucose, Bld 125 (*)    Calcium 8.5 (*)    All other components within normal limits  CBC WITH DIFFERENTIAL/PLATELET - Abnormal; Notable for the following:    Lymphs Abs 0.6 (*)    All other components within normal limits  INFLUENZA PANEL BY PCR (TYPE A & B) - Abnormal; Notable for the following:    Influenza B By PCR POSITIVE (*)    All other components within normal limits  I-STAT CG4 LACTIC ACID, ED - Abnormal; Notable for the following:    Lactic Acid, Venous 2.39 (*)    All other components within normal limits  CULTURE, BLOOD (ROUTINE X 2)  CULTURE, BLOOD (ROUTINE X 2)  URINALYSIS, ROUTINE W REFLEX MICROSCOPIC  I-STAT CG4 LACTIC ACID, ED  I-STAT CG4 LACTIC ACID, ED  I-STAT CG4 LACTIC ACID, ED    EKG  EKG Interpretation  Date/Time:  Saturday November 08 2016 16:23:25 EDT Ventricular Rate:  100 PR Interval:    QRS Duration: 94 QT Interval:  408 QTC Calculation: 527 R Axis:   33 Text  Interpretation:  Sinus tachycardia with irregular rate Anteroseptal infarct, old Nonspecific repol abnormality, diffuse leads Prolonged QT interval Confirmed by LIU MD, Hinton Dyer ([id number]) on 11/08/2016 4:36:47 PM       Radiology  Dg Chest 2 View  Result Date: 11/08/2016 CLINICAL DATA:  Cough, nausea, generalized weakness for 2 weeks EXAM: CHEST  2 VIEW COMPARISON:  02/22/2016 FINDINGS: Cardiomediastinal silhouette is stable. No infiltrate or pulmonary edema. Degenerative changes bilateral shoulders again noted. Again noted diffuse sclerotic bone metastases with progression from prior exam. IMPRESSION: No infiltrate or pulmonary edema. Again noted diffuse sclerotic bone metastases with progression from prior exam. Electronically Signed   By: Lahoma Crocker M.D.   On: 11/08/2016 17:08    Procedures Procedures (including critical care time)  CRITICAL CARE Performed by: Hanley Hays   Total critical care time: 45 minutes  Critical care time was exclusive of separately billable procedures and treating other patients.  Critical care was necessary to treat or prevent imminent or life-threatening deterioration.  Critical care was time spent personally by me on the following activities: development of treatment plan with patient and/or surrogate as well as nursing, discussions with consultants, evaluation of patient's response to treatment, examination of patient, obtaining history from patient or surrogate, ordering and performing treatments and interventions, ordering and review of laboratory studies, ordering and review of radiographic studies, pulse oximetry and re-evaluation of patient's condition.   Medications Ordered in ED Medications  sodium chloride 0.9 % bolus 1,000 mL (0 mLs Intravenous Stopped 11/08/16 1704)    And  sodium chloride 0.9 % bolus 1,000 mL (0 mLs Intravenous Stopped 11/08/16 1813)    And  sodium chloride 0.9 % bolus 500 mL (500 mLs Intravenous New Bag/Given 11/08/16 1803)   acetaminophen (TYLENOL) tablet 650 mg (650 mg Oral Given 11/08/16 1705)  ondansetron (ZOFRAN) injection 4 mg (4 mg Intravenous Given 11/08/16 1705)  cefTRIAXone (ROCEPHIN) 1 g in dextrose 5 % 50 mL IVPB (0 g Intravenous Stopped 11/08/16 1759)  azithromycin (ZITHROMAX) 500 mg in dextrose 5 % 250 mL IVPB (0 mg Intravenous Stopped 11/08/16 1923)  oseltamivir (TAMIFLU) capsule 75 mg (75 mg Oral Given 11/08/16 1757)     Initial Impression / Assessment and Plan / ED Course  I have reviewed the triage vital signs and the nursing notes.  Pertinent labs & imaging results that were available during my care of the patient were reviewed by me and considered in my medical decision making (see chart for details).    This is a 83 y.o. Male who presents to the ED with his wife complaining of flu like symptoms for the past 12 days, that have worsened over the past two days. Patient reports for almost 2 weeks patient has had cough, sneezing, nasal congestion, and body aches. He reports over the past 2 days he's had worsening body aches, chills, subjective fever and cough. He was seen by an urgent care about 10 days ago and was told he had "a touch of the pneumonia." He was started on doxycycline. He reports no improvement since starting doxycycline, and in fact over the last 2 days he's felt worse. On arrival to the emergency department the patient has a rectal temperature of 101.4. On exam he is nontoxic appearing. He has diminished lung sounds his bilateral lower fields. No increased work of breathing. His abdomen is soft and nontender to palpation. He is normotensive. He is not tachycardic.  Lactic acid returned elevated at 2.39. Code sepsis activated.  Prior to all test results being back antibiotics for community-acquired pneumonia were initiated.  Chest x-ray showed no infiltrate or pulmonary edema. CBC is unremarkable. Influenza screen is positive for influenza the. Patient started on Tamiflu. Will admit for  sepsis and influenza. At recheck patient reports he is feeling better with tylenol. Patient agrees with plan for admission. He is not tachycardic, hypotensive or hypoxic on repeat exam.   I consulted with Dr. Mariann Barter who accepted the patient for admission.   This patient was discussed with and evaluated by Dr. Oleta Mouse who agrees with assessment and plan.   Final Clinical Impressions(s) / ED Diagnoses   Final diagnoses:  Influenza B  Sepsis, due to unspecified organism [facility])    New Prescriptions New Prescriptions   No medications on file     Shemuel Harkleroad, PA-C 11/08/16 [place] Liu, MD 11/09/16 [place], MD 11/09/16 1236

## 2016-11-09 ENCOUNTER — Other Ambulatory Visit: Payer: Self-pay

## 2016-11-09 DIAGNOSIS — C61 Malignant neoplasm of prostate: Secondary | ICD-10-CM | POA: Diagnosis not present

## 2016-11-09 DIAGNOSIS — J101 Influenza due to other identified influenza virus with other respiratory manifestations: Secondary | ICD-10-CM | POA: Diagnosis not present

## 2016-11-09 DIAGNOSIS — R9431 Abnormal electrocardiogram [ECG] [EKG]: Secondary | ICD-10-CM | POA: Diagnosis not present

## 2016-11-09 DIAGNOSIS — A4189 Other specified sepsis: Secondary | ICD-10-CM | POA: Diagnosis not present

## 2016-11-09 DIAGNOSIS — B9789 Other viral agents as the cause of diseases classified elsewhere: Secondary | ICD-10-CM | POA: Diagnosis not present

## 2016-11-09 DIAGNOSIS — E872 Acidosis: Secondary | ICD-10-CM | POA: Diagnosis not present

## 2016-11-09 DIAGNOSIS — I2510 Atherosclerotic heart disease of native coronary artery without angina pectoris: Secondary | ICD-10-CM | POA: Diagnosis not present

## 2016-11-09 LAB — LACTIC ACID, PLASMA: LACTIC ACID, VENOUS: 1.2 mmol/L (ref 0.5–1.9)

## 2016-11-09 LAB — CBC
HCT: 36.5 % — ABNORMAL LOW (ref 39.0–52.0)
Hemoglobin: 11.9 g/dL — ABNORMAL LOW (ref 13.0–17.0)
MCH: 27.1 pg (ref 26.0–34.0)
MCHC: 32.6 g/dL (ref 30.0–36.0)
MCV: 83.1 fL (ref 78.0–100.0)
PLATELETS: 179 10*3/uL (ref 150–400)
RBC: 4.39 MIL/uL (ref 4.22–5.81)
RDW: 13.9 % (ref 11.5–15.5)
WBC: 4.5 10*3/uL (ref 4.0–10.5)

## 2016-11-09 LAB — COMPREHENSIVE METABOLIC PANEL
ALK PHOS: 77 U/L (ref 38–126)
ALT: 16 U/L — AB (ref 17–63)
AST: 25 U/L (ref 15–41)
Albumin: 3.3 g/dL — ABNORMAL LOW (ref 3.5–5.0)
Anion gap: 3 — ABNORMAL LOW (ref 5–15)
BILIRUBIN TOTAL: 0.3 mg/dL (ref 0.3–1.2)
BUN: 15 mg/dL (ref 6–20)
CALCIUM: 7.5 mg/dL — AB (ref 8.9–10.3)
CO2: 24 mmol/L (ref 22–32)
CREATININE: 0.76 mg/dL (ref 0.61–1.24)
Chloride: 110 mmol/L (ref 101–111)
Glucose, Bld: 133 mg/dL — ABNORMAL HIGH (ref 65–99)
Potassium: 3.7 mmol/L (ref 3.5–5.1)
Sodium: 137 mmol/L (ref 135–145)
TOTAL PROTEIN: 5.9 g/dL — AB (ref 6.5–8.1)

## 2016-11-09 LAB — PHOSPHORUS: Phosphorus: 2.4 mg/dL — ABNORMAL LOW (ref 2.5–4.6)

## 2016-11-09 LAB — TSH: TSH: 0.791 u[IU]/mL (ref 0.350–4.500)

## 2016-11-09 LAB — MAGNESIUM: Magnesium: 1.8 mg/dL (ref 1.7–2.4)

## 2016-11-09 MED ORDER — OSELTAMIVIR PHOSPHATE 75 MG PO CAPS: 75.0000 mg | ORAL_CAPSULE | Freq: Two times a day (BID) | ORAL | 0 refills | Status: DC

## 2016-11-09 NOTE — Progress Notes (Signed)
Occupational Therapy Evaluation Patient Details Name: Frank Petersen MRN: [id number] DOB: 1933-08-03 Today's Date: 11/09/2016    History of Present Illness Frank Petersen is a 83 y.o. male with medical history significant of prostate cancer, CAD, arthritis,GERD, HLD, HTN. Presented with fever cough generalized body aches worse for the past 2 days. Diagnosed with pneumonia.   Clinical Impression   Limited evaluation this date due to patient reporting headache and patient reporting that he recently got OOB with nursing to bathroom where he toileted and bathed. OT will continue to follow while hospitalized to address ADL independence and safety.    Follow Up Recommendations  Home health OT;Supervision/Assistance - 24 hour    Equipment Recommendations  Other (comment);Tub/shower seat (Patient reports he gets his equipment from the [place])    Recommendations for Other Services PT consult     Precautions / Restrictions Precautions Precautions: Fall Precaution Comments: pt reports frequent falls; most recent fall was last month Restrictions Weight Bearing Restrictions: No      Mobility Bed Mobility Overal bed mobility: Needs Assistance Bed Mobility: Supine to Sit;Sit to Supine     Supine to sit: Supervision Sit to supine: Supervision      Transfers                 General transfer comment: Declined further mobility -- recently up with nursing and had a headache/wanted tor est    Balance                                           ADL either performed or assessed with clinical judgement   ADL Overall ADL's : Needs assistance/impaired Eating/Feeding: Bed level;Set up   Grooming: Wash/dry hands;Wash/dry face;Set up;Bed level                                 General ADL Comments: Patient reports he got up earlier with nursing staff and toileted/bathed with supervision and assistance with IV pole. He said he is feeling "150% better"  than yesterday and asking when he is going home.     Vision         Perception     Praxis      Pertinent Vitals/Pain Pain Assessment: 0-10 Pain Score: 2  Pain Location: headache Pain Descriptors / Indicators: Headache Pain Intervention(s): Limited activity within patient's tolerance;Monitored during session     Hand Dominance Right   Extremity/Trunk Assessment Upper Extremity Assessment Upper Extremity Assessment: Generalized weakness;RUE deficits/detail;LUE deficits/detail RUE Deficits / Details: reports numbness R hand LUE Deficits / Details: L shoulder with limited AROM. Reports numbness L hand, drops things often   Lower Extremity Assessment Lower Extremity Assessment: Defer to PT evaluation       Communication Communication Communication: HOH   Cognition Arousal/Alertness: Awake/alert Behavior During Therapy: WFL for tasks assessed/performed Overall Cognitive Status: Within Functional Limits for tasks assessed                                     General Comments       Exercises     Shoulder Instructions      Home Living Family/patient expects to be discharged to:: Private residence Living Arrangements: Spouse/significant other;Children Available Help at Discharge: Family;Available PRN/intermittently Type  of Home: House             Bathroom Shower/Tub: Walk-in Psychologist, prison and probation services: Standard     Home Equipment: Environmental consultant - 4 wheels;Cane - quad;Electric scooter   Additional Comments: Patient reports he usually uses walking stick. He has a quad cane and Rollator that the [place] gave him but does not use. [facility] also [facility] an Transport planner and he uses that in the community.      Prior Functioning/Environment Level of Independence: Independent with assistive device(s)        Comments: Reports he still drives. He has a lift on his vehicle for his electric scooter.        OT Problem List: Decreased strength;Decreased  activity tolerance;Impaired balance (sitting and/or standing);Decreased knowledge of use of DME or AE;Impaired sensation;Pain      OT Treatment/Interventions: Self-care/ADL training;DME and/or AE instruction;Therapeutic activities;Patient/family education    OT Goals(Current goals can be found in the care plan section) Acute Rehab OT Goals Patient Stated Goal: to go home OT Goal Formulation: With patient Time For Goal Achievement: 11/23/16 Potential to Achieve Goals: Good ADL Goals Pt Will Perform Upper Body Bathing: with set-up;sitting Pt Will Perform Lower Body Bathing: with supervision;sit to/from stand Pt Will Perform Upper Body Dressing: with set-up;sitting Pt Will Perform Lower Body Dressing: with supervision;sit to/from stand Pt Will Transfer to Toilet: with supervision;ambulating;regular height toilet Pt Will Perform Toileting - Clothing Manipulation and hygiene: with supervision;sit to/from stand Pt Will Perform Tub/Shower Transfer: Shower transfer;with supervision;shower seat;ambulating  OT Frequency: Min 2X/week   Barriers to D/C:            Co-evaluation              End of Session    Activity Tolerance: Patient limited by fatigue Patient left: in bed;with call bell/phone within reach;with bed alarm set  OT Visit Diagnosis: Repeated falls (R29.6);Unsteadiness on feet (R26.81)                Time: 8563-1497 OT Time Calculation (min): 19 min Charges:  OT General Charges $OT Visit: 1 Procedure OT Evaluation $OT Eval Low Complexity: 1 Procedure G-Codes: OT G-codes **NOT FOR INPATIENT CLASS** Functional Assessment Tool Used: Clinical judgement Functional Limitation: Self care Self Care Current Status (W2637): At least 20 percent but less than 40 percent impaired, limited or restricted Self Care Goal Status (C5885): At least 1 percent but less than 20 percent impaired, limited or restricted     Rajan Burgard A Aizlynn Digilio 11/09/2016, 11:25 AM

## 2016-11-09 NOTE — Care Management Note (Addendum)
Case Management Note  Patient Details  Name: WAYNE WICKLUND MRN: [id number] Date of Birth: August 11, 1933  Subjective/Objective:   Sepsis, influenza B                 Action/Plan: Discharge Planning: AVS reviewed: NCM spoke to pt and states he lives at home with wife, Eustaquio Maize. Has RW at home. Continues to drive to his appts. States he is going to [employer] PT at [place]. Wishes to continue outpt PT at [place]. Declines HHPT. Faxed dc summary to [facility]. Pt declines tub bench. States [place] will provide DME if needed.    PCP [place] Expected Discharge Date:  11/09/16               Expected Discharge Plan:  Home/Self Care  In-House Referral:  NA  Discharge planning Services  CM Consult  Post Acute Care Choice:  NA Choice offered to:  NA  DME Arranged:  N/A DME Agency:  NA  HH Arranged:  Patient Refused [place] Agency:  NA  Status of Service:  Completed, signed off  If discussed at [place] of Stay Meetings, dates discussed:    Additional Comments:  Erenest Rasher, RN 11/09/2016, 12:54 PM

## 2016-11-09 NOTE — Progress Notes (Signed)
Reviewed discharge education papers with patient and wife.  Wife signed d/c papers, and patient wheeled out in wheelchair to go home.  Shareef Eddinger Roselie Awkward RN

## 2016-11-09 NOTE — Discharge Summary (Signed)
Discharge Summary  Frank Petersen UUV:[id number] DOB: February 26, 1933  PCP: Sandy Level date: 11/08/2016 Discharge date: 11/09/2016  Time spent: 25 minutes   Recommendations for Outpatient Follow-up:  1. New medication: Tamiflu 75 mg by mouth twice a day 5 more days 2. Patient being set up for home health PT and OT with tub bench   Discharge Diagnoses:  Active Hospital Problems   Diagnosis Date Noted  . Sepsis ([place]) 11/08/2016  . Influenza B 11/08/2016  . Dehydration 11/08/2016  . Prolonged QT interval 11/08/2016  . CAD (coronary artery disease) 09/18/2014  . GERD (gastroesophageal reflux disease)   . Prostate cancer [facility])     Resolved Hospital Problems   Diagnosis Date Noted Date Resolved  No resolved problems to display.    Discharge Condition: Improved, being discharged home   Diet recommendation: Heart healthy   Vitals:   11/08/16 2107 11/09/16 0652  BP: 124/63 130/74  Pulse: 66 (!) 52  Resp: 20 16  Temp: 98.4 F (36.9 C) 98.2 F (36.8 C)    History of present illness:  83 year old male with past medical history prostate cancer metastatic as well as CAD who is had some upper respiratory symptoms for the past few weeks including congestion, cough and had been diagnosed about 1-2 weeks ago with reportedly mild pneumonia and put on by mouth doxycycline. Symptoms persisted after*having fever and body aches, was brought in to the emergency room on 3/24. Influenza B titer positive and patient also noted to have a lactic acid level of 2.39+ hypotensive.  Chest x-ray unrevealing. White count normal. Patient felt to have sepsis secondary to influenza. Admitted to hospitalist service.   Hospital Course:  Principal Problem:   Sepsis ([place]) secondary to influenza B: Patient met criteria for sepsis on admission given hypotension, tachycardia, lactic acidosis and pulmonary source secondary to influenza B. Patient was aggressively volume resuscitated and started  on Tamiflu. Lactic acid level trended downward. By morning of 3/25, lactic acid level normal. Blood pressure stable. Patient feeling much better. He was not hypoxic. We ambulated around the unit and patient did so without dizziness or shortness of breath. Felt to be stable for discharge and will be discharged on 5 more days of Tamiflu. Active Problems:   Prostate cancer [facility]) metastatic: Noted evidence of metastases on x-ray. Stable at this time. Overall he does have some mild weakness and deconditioning. Seen by occupational therapy who recommended home health PT and OT plus tub bench   GERD (gastroesophageal reflux disease)   CAD (coronary artery disease)    Prolonged QT interval: Noted on EKG on admission. Repeat EKG this morning was normal   Procedures:  None   Consultations:  None   Discharge Exam: BP 130/74 (BP Location: Left Arm)   Pulse (!) 52   Temp 98.2 F (36.8 C) (Oral)   Resp 16   Ht 6' 2" (1.88 m)   Wt 79 kg (174 lb 2.6 oz)   SpO2 98%   BMI 22.36 kg/m   General: Alert and oriented 3, no acute distress  Cardiovascular: Regular rate and rhythm, S1-S2  Respiratory: Clear auscultation bilaterally   Discharge Instructions You were cared for by a hospitalist during your hospital stay. If you have any questions about your discharge medications or the care you received while you were in the hospital after you are discharged, you can call the unit and asked to speak with the hospitalist on call if the hospitalist that took care of you  is not available. Once you are discharged, your primary care physician will handle any further medical issues. Please note that NO REFILLS for any discharge medications will be authorized once you are discharged, as it is imperative that you return to your primary care physician (or establish a relationship with a primary care physician if you do not have one) for your aftercare needs so that they can reassess your need for medications and  monitor your lab values.  Discharge Instructions    Diet - low sodium heart healthy    Complete by:  As directed    Increase activity slowly    Complete by:  As directed      Allergies as of 11/09/2016      Reactions   Aspirin Other (See Comments)   GI Upset    Codeine Nausea Only   Morphine Itching      Medication List    STOP taking these medications   doxycycline 100 MG tablet Commonly known as:  VIBRA-TABS     TAKE these medications   calcium-vitamin D 500-200 MG-UNIT tablet Commonly known as:  OSCAL WITH D Take 1 tablet by mouth 2 (two) times daily.   CRESTOR 40 MG tablet Generic drug:  rosuvastatin Take 20 mg by mouth at bedtime.   fentaNYL 12 MCG/HR Commonly known as:  DURAGESIC - dosed mcg/hr Place 12.5 mcg onto the skin every 3 (three) days.   fluticasone 50 MCG/ACT nasal spray Commonly known as:  FLONASE Place 1 spray into both nostrils daily as needed for allergies.   gabapentin 300 MG capsule Commonly known as:  NEURONTIN Take 600 mg by mouth 3 (three) times daily.   hydroxypropyl methylcellulose / hypromellose 2.5 % ophthalmic solution Commonly known as:  ISOPTO TEARS / GONIOVISC Place 2 drops into both eyes 3 (three) times daily as needed for dry eyes.   leuprolide (6 Month) 45 MG injection Commonly known as:  ELIGARD Inject 45 mg into the skin every 6 (six) months.   LORazepam 0.5 MG tablet Commonly known as:  ATIVAN Take 0.5 mg by mouth 2 (two) times daily as needed for anxiety.   meclizine 12.5 MG tablet Commonly known as:  ANTIVERT Take 12.5 mg by mouth 3 (three) times daily as needed for dizziness.   naproxen 500 MG tablet Commonly known as:  NAPROSYN Take 500 mg by mouth daily as needed for mild pain.   ondansetron 4 MG disintegrating tablet Commonly known as:  ZOFRAN ODT Take 1 tablet (4 mg total) by mouth every 6 (six) hours as needed for nausea or vomiting.   oseltamivir 75 MG capsule Commonly known as:  TAMIFLU Take 1  capsule (75 mg total) by mouth 2 (two) times daily.   predniSONE 5 MG tablet Commonly known as:  DELTASONE Take 5 mg by mouth daily.   ranitidine 300 MG tablet Commonly known as:  ZANTAC Take 300 mg by mouth at bedtime.   sertraline 100 MG tablet Commonly known as:  ZOLOFT Take 200 mg by mouth every morning.   traMADol 50 MG tablet Commonly known as:  ULTRAM Take 100 mg by mouth 2 (two) times daily.   Vitamin D (Cholecalciferol) 1000 units Tabs Take 1,000 Units by mouth daily.   ZOLEDRONIC ACID IV Inject into the vein. EVERY THREE MONTHS   ZYTIGA 250 MG tablet Generic drug:  abiraterone Acetate Take 1,000 mg by mouth daily.            Durable Medical Equipment  Start     Ordered   11/09/16 1158  For home use only DME Tub bench  Once     11/09/16 1158     Allergies  Allergen Reactions  . Aspirin Other (See Comments)    GI Upset   . Codeine Nausea Only  . Morphine Itching      The results of significant diagnostics from this hospitalization (including imaging, microbiology, ancillary and laboratory) are listed below for reference.    Significant Diagnostic Studies: Dg Chest 2 View  Result Date: 11/08/2016 CLINICAL DATA:  Cough, nausea, generalized weakness for 2 weeks EXAM: CHEST  2 VIEW COMPARISON:  02/22/2016 FINDINGS: Cardiomediastinal silhouette is stable. No infiltrate or pulmonary edema. Degenerative changes bilateral shoulders again noted. Again noted diffuse sclerotic bone metastases with progression from prior exam. IMPRESSION: No infiltrate or pulmonary edema. Again noted diffuse sclerotic bone metastases with progression from prior exam. Electronically Signed   By: Lahoma Crocker M.D.   On: 11/08/2016 17:08    Microbiology: No results found for this or any previous visit (from the past 240 hour(s)).   Labs: Basic Metabolic Panel:  Recent Labs Lab 11/08/16 1606 11/09/16 0032  NA 134* 137  K 3.8 3.7  CL 103 110  CO2 23 24  GLUCOSE  125* 133*  BUN 19 15  CREATININE 0.82 0.76  CALCIUM 8.5* 7.5*  MG  --  1.8  PHOS  --  2.4*   Liver Function Tests:  Recent Labs Lab 11/08/16 1606 11/09/16 0032  AST 30 25  ALT 17 16*  ALKPHOS 94 77  BILITOT 0.5 0.3  PROT 7.0 5.9*  ALBUMIN 3.8 3.3*   No results for input(s): LIPASE, AMYLASE in the last 168 hours. No results for input(s): AMMONIA in the last 168 hours. CBC:  Recent Labs Lab 11/08/16 1606 11/09/16 0032  WBC 6.2 4.5  NEUTROABS 4.8  --   HGB 14.0 11.9*  HCT 42.2 36.5*  MCV 84.6 83.1  PLT 223 179   Cardiac Enzymes: No results for input(s): CKTOTAL, CKMB, CKMBINDEX, TROPONINI in the last 168 hours. BNP: BNP (last 3 results) No results for input(s): BNP in the last 8760 hours.  ProBNP (last 3 results) No results for input(s): PROBNP in the last 8760 hours.  CBG: No results for input(s): GLUCAP in the last 168 hours.     Signed:  Annita Brod, MD [facility] 11/09/2016, 12:05 PM

## 2016-11-13 LAB — CULTURE, BLOOD (ROUTINE X 2)
CULTURE: NO GROWTH
Culture: NO GROWTH

## 2017-02-26 ENCOUNTER — Ambulatory Visit (INDEPENDENT_AMBULATORY_CARE_PROVIDER_SITE_OTHER): Payer: Federal, State, Local not specified - PPO | Admitting: Cardiovascular Disease

## 2017-02-26 ENCOUNTER — Encounter: Payer: Self-pay | Admitting: Cardiovascular Disease

## 2017-02-26 VITALS — BP 120/74 | HR 72 | Ht 73.0 in | Wt 160.0 lb

## 2017-02-26 DIAGNOSIS — I2510 Atherosclerotic heart disease of native coronary artery without angina pectoris: Secondary | ICD-10-CM

## 2017-02-26 DIAGNOSIS — I498 Other specified cardiac arrhythmias: Secondary | ICD-10-CM | POA: Diagnosis not present

## 2017-02-26 DIAGNOSIS — E782 Mixed hyperlipidemia: Secondary | ICD-10-CM | POA: Diagnosis not present

## 2017-02-26 NOTE — Progress Notes (Signed)
Cardiology Office Note:    Date:  02/27/2017   ID:  Frank Petersen, DOB 12/09/1932, MRN [id number]  PCP:  [facility]  Cardiologist:  Sanda Klein, MD    Referring MD: [facility]   Chief Complaint  Patient presents with  . Follow-up  Coronary artery disease, recent evaluation for chest pain at [facility] emergency department  History of Present Illness:    Frank Petersen is a 83 y.o. male with a hx of Coronary artery disease (small and STEMI, 99% second OM stenosis, DES x 19 Jul 2014), hyperlipidemia, widely spread metastatic prostate cancer responding well to androgen deprivation therapy, depression, hyperlipidemia on high-dose statin, recently seen in [facility] ED with complaints of chest pain, reportedly with benign ECG and biomarkers and resolution of symptoms with proton pump inhibitor. Irregular rhythm was also reported on telemetry during that admission, initially felt to be atrial fibrillation, not confirmed on additional review.  This is his first cardiology visit here since November 2016, although he has been seen at the [facility] as well.  He does not have exertional chest complaints. He has occasional electric shock like twinges in the precordial area at rest. He does complain of occasional irregular unpleasant heartbeats associate with some shortness of breath, at rest. These occur unpredictably and intermittently, sometimes 2 or 3 times a week, sometimes less than once a month. He denies syncope, orthopnea, PND, leg edema. He complains of numbness down the external surface of his right leg and walks with a cane. He does not have other neurological complaints. Depression symptoms seem to be under control. Does not have a lot of bone pain, although he is known to have widespread metastatic prostate cancer involving the skeleton. He complains of very easy bruising, but is taking a full throughout 25 mg dose of aspirin.    Past Medical  History:  Diagnosis Date  . Anxiety   . Arthritis    "left shoulder" (08/01/2014)  . CAD (coronary artery disease)    s/p cath 08/02/2014 DES x 2 to RCA  . Chronic lower back pain   . Depression   . GERD (gastroesophageal reflux disease)   . Heart murmur   . High cholesterol    hx (08/01/2014)  . Hypertension    hx (08/01/2014)  . Migraine    "stopped ~ age 59" (08/01/2014)  . Prostate cancer metastatic to bone [facility])    "stage IV" (08/01/2014)  . Stented coronary artery 09/18/2014   OM 2 branch of left circumflex to 08/02/2014 with 2 drug-eluting stents (2.5 x 15 mm and 2.25 x 54mm Xience [place])  . Vertigo     Past Surgical History:  Procedure Laterality Date  . LEFT HEART CATHETERIZATION WITH CORONARY ANGIOGRAM N/A 08/02/2014   Procedure: LEFT HEART CATHETERIZATION WITH CORONARY ANGIOGRAM;  Surgeon: Burnell Blanks, MD;  Location: [facility] CATH LAB;  Service: Cardiovascular;  Laterality: N/A;  . PERCUTANEOUS CORONARY STENT INTERVENTION (PCI-S)  08/02/2014   Procedure: PERCUTANEOUS CORONARY STENT INTERVENTION (PCI-S);  Surgeon: Burnell Blanks, MD;  Location: [facility] CATH LAB;  Service: Cardiovascular;;  . PILONIDAL CYST EXCISION  1960's  . PROSTATECTOMY  2007  . TONSILLECTOMY      Current Medications: Current Meds  Medication Sig  . calcium-vitamin D (OSCAL WITH D) 500-200 MG-UNIT per tablet Take 1 tablet by mouth 2 (two) times daily.  . fentaNYL (DURAGESIC - DOSED MCG/HR) 12 MCG/HR Place 12.5 mcg onto the skin every 3 (three) days.   Marland Kitchen  FLUoxetine (PROZAC) 20 MG tablet Take 20 mg by mouth daily.  . fluticasone (FLONASE) 50 MCG/ACT nasal spray Place 1 spray into both nostrils daily as needed for allergies.   Marland Kitchen gabapentin (NEURONTIN) 300 MG capsule Take 300 mg by mouth 3 (three) times daily.   . hydroxypropyl methylcellulose / hypromellose (ISOPTO TEARS / GONIOVISC) 2.5 % ophthalmic solution Place 2 drops into both eyes 3 (three) times daily as needed for dry eyes.  Marland Kitchen  leuprolide, 6 Month, (ELIGARD) 45 MG injection Inject 45 mg into the skin every 6 (six) months.  . loratadine (CLARITIN) 10 MG tablet Take 10 mg by mouth daily as needed for allergies.  Marland Kitchen LORazepam (ATIVAN) 0.5 MG tablet Take 0.5 mg by mouth 2 (two) times daily as needed for anxiety.   . meclizine (ANTIVERT) 12.5 MG tablet Take 25 mg by mouth 3 (three) times daily as needed for dizziness.   . naproxen (NAPROSYN) 500 MG tablet Take 500 mg by mouth daily as needed for mild pain.   Marland Kitchen omeprazole (PRILOSEC) 20 MG capsule Take 20 mg by mouth as needed.  . predniSONE (DELTASONE) 5 MG tablet Take 5 mg by mouth 2 (two) times daily with a meal.   . ranitidine (ZANTAC) 300 MG tablet Take 300 mg by mouth at bedtime.  . rosuvastatin (CRESTOR) 40 MG tablet Take 20 mg by mouth at bedtime.   . sertraline (ZOLOFT) 100 MG tablet Take 100 mg by mouth every morning.   . traMADol (ULTRAM) 50 MG tablet Take 100 mg by mouth 2 (two) times daily.   . Vitamin D, Cholecalciferol, 1000 UNITS TABS Take 1,000 Units by mouth daily.  Marland Kitchen ZOLEDRONIC ACID IV Inject into the vein. EVERY THREE MONTHS  . ZYTIGA 250 MG tablet Take 1,000 mg by mouth daily.  . [DISCONTINUED] ondansetron (ZOFRAN ODT) 4 MG disintegrating tablet Take 1 tablet (4 mg total) by mouth every 6 (six) hours as needed for nausea or vomiting.  . [DISCONTINUED] oseltamivir (TAMIFLU) 75 MG capsule Take 1 capsule (75 mg total) by mouth 2 (two) times daily.     Allergies:   Aspirin; Codeine; and Morphine   Social History   Social History  . Marital status: Married    Spouse name: N/A  . Number of children: N/A  . Years of education: N/A   Social History Main Topics  . Smoking status: Former Smoker    Types: Cigarettes, Cigars  . Smokeless tobacco: Never Used  . Alcohol use Yes     Comment: 08/01/2014 "stopped drinking in ~ 2007; after rehab"  . Drug use: No  . Sexual activity: Yes   Other Topics Concern  . None   Social History Narrative  . None       Family History: The patient's family history includes Breast cancer in his sister; CAD in his brother and father; Diabetes in his mother and sister. ROS:   Please see the history of present illness.    All other systems reviewed and are negative.  EKGs/Labs/Other Studies Reviewed:     EKG:  EKG is ordered today.  The ekg ordered today demonstrates Sinus rhythm with grouped beating. The pattern is of trigeminy. However all the P waves look the same and there is subtle variability in the PR interval, with mild prolongation at baseline. I wonder whether he has sinoatrial block Mobitz type I with a 4:3 periodicity. He has old T-wave inversions in leads 1 and aVL. Normal QTC 441 ms  Recent Labs: 11/09/2016:  ALT 16; BUN 15; Creatinine, Ser 0.76; Hemoglobin 11.9; Magnesium 1.8; Platelets 179; Potassium 3.7; Sodium 137; TSH 0.791  Recent Lipid Panel    Component Value Date/Time   CHOL 278 (H) 08/02/2014 0245   TRIG 270 (H) 08/02/2014 0245   HDL 37 (L) 08/02/2014 0245   CHOLHDL 7.5 08/02/2014 0245   VLDL 54 (H) 08/02/2014 0245   LDLCALC 187 (H) 08/02/2014 0245    Physical Exam:    VS:  BP 120/74   Pulse 72   Ht 6\' 1"  (1.854 m)   Wt 160 lb (72.6 kg)   BMI 21.11 kg/m     Wt Readings from Last 3 Encounters:  02/26/17 160 lb (72.6 kg)  11/08/16 174 lb 2.6 oz (79 kg)  06/26/15 171 lb (77.6 kg)     GEN: Rather slender, but Well nourished, well developed in no acute distress HEENT: Normal NECK: No JVD; No carotid bruits LYMPHATICS: No lymphadenopathy CARDIAC: Regularly irregular with a trigeminal pattern, no murmurs, rubs, gallops RESPIRATORY:  Clear to auscultation without rales, wheezing or rhonchi  ABDOMEN: Soft, non-tender, non-distended MUSCULOSKELETAL:  No edema; No deformity  SKIN: Warm and dry, multiple small ecchymoses NEUROLOGIC:  Alert and oriented x 3 PSYCHIATRIC:  Normal affect   ASSESSMENT:    1. Coronary artery disease involving native coronary artery of native  heart without angina pectoris   2. Mixed hyperlipidemia   3. Other cardiac arrhythmia    PLAN:    In order of problems listed above:  1. CAD: Asymptomatic, no angina; not taking aspirin due to history of GI disturbances. Reluctant to prescribe clopidogrel or other agents because of very easy bruising. 2. HLP: Taking a highly effective statin. LDL off therapy was extremely high at 192. Has not been rechecked since he restarted taking rosuvastatin. Get updated labs. 3. Arrhythmia: I have not seen any documentation of true atrial fibrillation. I believe this most likely that he has a Wenckebach type sinoatrial block, which both via cursory review of his telemetry already physical exam could be misinterpreted as atrial fibrillation. Reviewed options for a 30 day event monitor or a self purchased Kardia device and he prefers the latter since it will provide longer term monitoring.   Medication Adjustments/Labs and Tests Ordered: Current medicines are reviewed at length with the patient today.  Concerns regarding medicines are outlined above.  No orders of the defined types were placed in this encounter.  No orders of the defined types were placed in this encounter.   Signed, Sanda Klein, MD  02/27/2017 3:42 PM    [place]

## 2017-02-26 NOTE — Patient Instructions (Signed)
Dr Sallyanne Kuster recommends that you schedule a follow-up appointment in 12 months. You will receive a reminder letter in the mail two months in advance. If you don't receive a letter, please call our office to schedule the follow-up appointment.  If you need a refill on your cardiac medications before your next appointment, please call your pharmacy.   Kardia device - [email or web address]

## 2018-01-25 ENCOUNTER — Telehealth: Payer: Self-pay | Admitting: Cardiovascular Disease

## 2018-01-25 NOTE — Telephone Encounter (Signed)
I wonder if POC troponin (less accurate) was abnormal, but lab troponin was 0.00 - we see that at [facility] sometimes. We do need records M[facility]

## 2018-01-25 NOTE — Telephone Encounter (Signed)
Contacted [facility] w/med records and she will work on getting records.

## 2018-01-25 NOTE — Telephone Encounter (Signed)
New Message    Patients wife is calling on behalf of spouse. She states that over the weekend he was seen at [facility] in [place]. She stated that he had elevated troponin that trended done and an echocardiogram. She wants Dr. Sallyanne Kuster to be aware and see what he may suggest. Please call.

## 2018-01-25 NOTE — Telephone Encounter (Signed)
Attempted to call patient's wife on work # but was on hold for 5+ minutes with no answer.   Called patient and spoke with wife - patient is "profoundly deaf" per wife  Patient went to [facility] d/t not feeling well. She reports his troponin was elevated initially but trends decreased to 0.0 by yesterday morning. Dr. Venia Minks @ [facility] ordered an echo and they were told that he had leaky valves but all else looked OK. These results are not available in care everywhere at time of call.   Scheduled patient for MD OV on 6/12 @ 0800 - he is due for 1 year visit in July 2019

## 2018-01-27 ENCOUNTER — Encounter: Payer: Self-pay | Admitting: Cardiovascular Disease

## 2018-01-27 ENCOUNTER — Ambulatory Visit (INDEPENDENT_AMBULATORY_CARE_PROVIDER_SITE_OTHER): Payer: Federal, State, Local not specified - PPO | Admitting: Cardiovascular Disease

## 2018-01-27 VITALS — BP 122/67 | HR 68 | Ht 73.0 in | Wt 153.0 lb

## 2018-01-27 DIAGNOSIS — I2510 Atherosclerotic heart disease of native coronary artery without angina pectoris: Secondary | ICD-10-CM

## 2018-01-27 DIAGNOSIS — E7800 Pure hypercholesterolemia, unspecified: Secondary | ICD-10-CM

## 2018-01-27 DIAGNOSIS — C61 Malignant neoplasm of prostate: Secondary | ICD-10-CM | POA: Diagnosis not present

## 2018-01-27 DIAGNOSIS — R55 Syncope and collapse: Secondary | ICD-10-CM | POA: Diagnosis not present

## 2018-01-27 NOTE — Patient Instructions (Signed)
Medication Instructions: Dr Sallyanne Kuster has recommended making the following medication changes: 1. START Aspirin 81 mg - take 1 tablet daily  Labwork: NONE ORDERED  Testing/Procedures: 1. Cardiac Event Monitor - 1 week - Your physician has recommended that you wear an event monitor. Event monitors are medical devices that record the heart's electrical activity. Doctors most often [place] these monitors to diagnose arrhythmias. Arrhythmias are problems with the speed or rhythm of the heartbeat. The monitor is a small, portable device. You can wear one while you do your normal daily activities. This is usually used to diagnose what is causing palpitations/syncope (passing out). >>This will be placed at our [facility] location [place], [place] [address] [phone]  Follow-up: Dr Sallyanne Kuster recommends that you schedule a follow-up appointment in 6 months. You will receive a reminder letter in the mail two months in advance. If you don't receive a letter, please call our office to schedule the follow-up appointment.  If you need a refill on your cardiac medications before your next appointment, please call your pharmacy.

## 2018-01-27 NOTE — Progress Notes (Signed)
Cardiology Office Note:    Date:  01/29/2018   ID:  Carin Primrose, DOB [birth year], MRN [id number]  PCP:  [facility]  Cardiologist:  Sanda Klein, MD    Referring MD: [facility]   Chief Complaint  Patient presents with  . Follow-up  Coronary artery disease  History of Present Illness:    ZADE FALKNER is a 84 y.o. male with a hx of Coronary artery disease (small NSTEMI, 99% second OM stenosis, DES x 19 Jul 2014), hyperlipidemia, widely spread metastatic prostate cancer on androgen deprivation therapy, depression, hyperlipidemia on high-dose statin, irregular rhythm, possible SA block.  He has been seen at [facility] and the [facility] as well.  He has lost weight.  He is having problems with anxiety and dyspnea.  He was seen in the [facility] just a few days ago.  Communicating with him as become challenging since his hearing is extremely poor.  As far as I can tell he went there because he felt unwell (anxiety?).  ECG was nondiagnostic.  Echocardiogram showed normal left ventricular regional wall motion and an ejection fraction of 60-65%.  CT angiogram of the chest did not show pulmonary embolism.  They did raise the concern for enteritis.  Extensive bony metastases were seen.  Several sequential high-sensitivity troponin assays that were relatively flat at 0.021-0.046.  He was not admitted to the hospital.  His wife was worried that he may have had atrial fibrillation on the monitor, but this is not documented in the medical record.  There is a mention of frequent PACs.  His prostate cancer has progressed and is widespread.  He was under hospice care but then decided to pursue additional treatment.  He actually received monthly radium infusions for a total of 6 sessions.  He is scheduled to have a bone scan on June 18.  Brain MRI and CT of the abdomen were "okay", but do show widespread bony metastasis.  He denies  exertional angina or dyspnea.  His episodes of shortness of breath happen randomly.  They sound like they are very brief lasting for just a few seconds at a time.  He has not had syncope and is not aware of any palpitations, although he does have frequent premature atrial beats.  He has had some episodes of unsteadiness and severe dizziness which might represent near syncope. he complains mostly of pain in his right leg.   Past Medical History:  Diagnosis Date  . Anxiety   . Arthritis    "left shoulder" (08/01/2014)  . CAD (coronary artery disease)    s/p cath 08/02/2014 DES x 2 to RCA  . Chronic lower back pain   . Depression   . GERD (gastroesophageal reflux disease)   . Heart murmur   . High cholesterol    hx (08/01/2014)  . Hypertension    hx (08/01/2014)  . Migraine    "stopped ~ age 61" (08/01/2014)  . Prostate cancer metastatic to bone [facility])    "stage IV" (08/01/2014)  . Stented coronary artery 09/18/2014   OM 2 branch of left circumflex to 08/02/2014 with 2 drug-eluting stents (2.5 x 15 mm and 2.25 x 20mm [facility]ience [facility])  . Vertigo     Past Surgical History:  Procedure Laterality Date  . LEFT HEART CATHETERIZATION WITH CORONARY ANGIOGRAM N/A 08/02/2014   Procedure: LEFT HEART CATHETERIZATION WITH CORONARY ANGIOGRAM;  Surgeon: Burnell Blanks, MD;  Location: [facility] CATH LAB;  Service: Cardiovascular;  Laterality: N/A;  . PERCUTANEOUS CORONARY STENT INTERVENTION (PCI-S)  08/02/2014   Procedure: PERCUTANEOUS CORONARY STENT INTERVENTION (PCI-S);  Surgeon: Burnell Blanks, MD;  Location: [facility] CATH LAB;  Service: Cardiovascular;;  . PILONIDAL CYST EXCISION  1960's  . PROSTATECTOMY  2007  . TONSILLECTOMY      Current Medications: Current Meds  Medication Sig  . aspirin EC 81 MG tablet Take 81 mg by mouth daily.  . calcium-vitamin D (OSCAL WITH D) 500-200 MG-UNIT per tablet Take 1 tablet by mouth 2 (two) times daily.  . Carboxymethylcellulose Sodium 0.25 % SOLN Apply to  eye.  . escitalopram (LEXAPRO) 10 MG tablet Take by mouth.  . fentaNYL (DURAGESIC - DOSED MCG/HR) 12 MCG/HR Place 12.5 mcg onto the skin every 3 (three) days.   . fluticasone (FLONASE) 50 MCG/ACT nasal spray Place 1 spray into both nostrils daily as needed for allergies.   Marland Kitchen gabapentin (NEURONTIN) 300 MG capsule Take 300 mg by mouth 3 (three) times daily.   . hydroxypropyl methylcellulose / hypromellose (ISOPTO TEARS / GONIOVISC) 2.5 % ophthalmic solution Place 2 drops into both eyes 3 (three) times daily as needed for dry eyes.  Marland Kitchen leuprolide, 6 Month, (ELIGARD) 45 MG injection Inject 45 mg into the skin every 6 (six) months.  . loratadine (CLARITIN) 10 MG tablet Take 10 mg by mouth daily as needed for allergies.  Marland Kitchen LORazepam (ATIVAN) 0.5 MG tablet Take 0.5 mg by mouth 2 (two) times daily as needed for anxiety.   . meclizine (ANTIVERT) 12.5 MG tablet Take 25 mg by mouth 3 (three) times daily as needed for dizziness.   . naproxen (NAPROSYN) 500 MG tablet Take 500 mg by mouth daily as needed for mild pain.   Marland Kitchen omeprazole (PRILOSEC) 20 MG capsule Take 20 mg by mouth as needed.  . predniSONE (DELTASONE) 5 MG tablet Take 5 mg by mouth 2 (two) times daily with a meal.   . Vitamin D, Cholecalciferol, 1000 UNITS TABS Take 1,000 Units by mouth daily.     Allergies:   Aspirin; Codeine; and Morphine   Social History   Socioeconomic History  . Marital status: Married    Spouse name: Not on file  . Number of children: Not on file  . Years of education: Not on file  . Highest education level: Not on file  Occupational History  . Not on file  Social Needs  . Financial resource strain: Not on file  . Food insecurity:    Worry: Not on file    Inability: Not on file  . Transportation needs:    Medical: Not on file    Non-medical: Not on file  Tobacco Use  . Smoking status: Former Smoker    Types: Cigarettes, Cigars  . Smokeless tobacco: Never Used  Substance and Sexual Activity  . Alcohol use:  Yes    Comment: 08/01/2014 "stopped drinking in ~ 2007; after rehab"  . Drug use: No  . Sexual activity: Yes  Lifestyle  . Physical activity:    Days per week: Not on file    Minutes per session: Not on file  . Stress: Not on file  Relationships  . Social connections:    Talks on phone: Not on file    Gets together: Not on file    Attends religious service: Not on file    Active member of club or organization: Not on file    Attends meetings of clubs or organizations: Not on file    Relationship  status: Not on file  Other Topics Concern  . Not on file  Social History Narrative  . Not on file     Family History: The patient's family history includes Breast cancer in his sister; CAD in his brother and father; Diabetes in his mother and sister. ROS:   Please see the history of present illness.    All other systems reviewed and are negative.  EKGs/Labs/Other Studies Reviewed:     EKG:  EKG is  ordered today.  The ekg ordered today sinus rhythm with frequent PACs but no ischemic abnormalities.  He has a previous incomplete left bundle branch block with a pattern of old septal infarct and a QRS duration of 114 ms.  Nonspecific repolarization changes are seen primarily in leads V5 and V6.  QTC is 448 ms.   Recent Labs: No results found for requested labs within last 8760 hours.  Recent Lipid Panel    Component Value Date/Time   CHOL 278 (H) 08/02/2014 0245   TRIG 270 (H) 08/02/2014 0245   HDL 37 (L) 08/02/2014 0245   CHOLHDL 7.5 08/02/2014 0245   VLDL 54 (H) 08/02/2014 0245   LDLCALC 187 (H) 08/02/2014 0245    January 24, 2018 [facility] Chol 181, TG 124, HDL 41, LDL 115  Physical Exam:    VS:  BP 122/67   Pulse 68   Ht 6\' 1"  (1.854 m)   Wt 153 lb (69.4 kg)   BMI 20.19 kg/m     Wt Readings from Last 3 Encounters:  01/27/18 153 lb (69.4 kg)  02/26/17 160 lb (72.6 kg)  11/08/16 174 lb 2.6 oz (79 kg)     General: Alert, oriented x3, extremely hard of hearing, no  distress, now appears to be truly under nourished Head: no evidence of trauma, PERRL, EOMI, no exophtalmos or lid lag, no myxedema, no xanthelasma; normal ears, nose and oropharynx Neck: normal jugular venous pulsations and no hepatojugular reflux; brisk carotid pulses without delay and no carotid bruits Chest: clear to auscultation, no signs of consolidation by percussion or palpation, normal fremitus, symmetrical and full respiratory excursions Cardiovascular: normal position and quality of the apical impulse, regular rhythm with frequent ectopy, normal first and second heart sounds, no murmurs, rubs or gallops Abdomen: no tenderness or distention, no masses by palpation, no abnormal pulsatility or arterial bruits, normal bowel sounds, no hepatosplenomegaly Extremities: no clubbing, cyanosis or edema; 2+ radial, ulnar and brachial pulses bilaterally; 2+ right femoral, posterior tibial and dorsalis pedis pulses; 2+ left femoral, posterior tibial and dorsalis pedis pulses; no subclavian or femoral bruits Neurological: grossly nonfocal, extremely hard of hearing Psych: Normal mood and affect   ASSESSMENT:    1. Coronary artery disease involving native coronary artery of native heart without angina pectoris   2. Hypercholesterolemia   3. Prostate cancer ([place])   4. Pre-syncope    PLAN:    In order of problems listed above:  1. CAD: He has not had angina pectoris in a long time and is asymptomatic today.  I am not sure what exactly led to his visit to the [facility], but his minimal elevation in cardiac troponin I was nonspecific and not consistent with a true acute coronary syndrome.  I do not think he is a good candidate for invasive evaluation or revascularization.  He had to be taken off aspirin due to bruising and bleeding at one point.  I think it is worth trying to put him back on aspirin.  He  is by not a candidate for elective coronary revascularization, either with surgery or  PCI. 2. HLP: He has lost a lot of weight.  Without statin therapy his LDL cholesterol was 115, down from 192 in the past. 3. Arrhythmia: He has not had documented atrial fibrillation.  He has frequent irregularity in his heart rhythm due to PACs and at times has had a pattern suggestive of sinoatrial block, second-degree Mobitz type I.  These could easily mimic atrial fibrillation. 4. Prostate cancer, widely metastatic: I think focus of care should be on palliation of symptoms. 5. Near syncope: We will check an event monitor, which should also help evaluate for any evidence of atrial fibrillation, which his wife has been concerned about.   Medication Adjustments/Labs and Tests Ordered: Current medicines are reviewed at length with the patient today.  Concerns regarding medicines are outlined above.  Orders Placed This Encounter  Procedures  . CARDIAC EVENT MONITOR  . EKG 12-Lead   No orders of the defined types were placed in this encounter.   Signed, Sanda Klein, MD  01/29/2018 6:24 PM    [place]

## 2018-01-29 ENCOUNTER — Encounter: Payer: Self-pay | Admitting: Cardiovascular Disease

## 2018-02-04 ENCOUNTER — Ambulatory Visit (INDEPENDENT_AMBULATORY_CARE_PROVIDER_SITE_OTHER): Payer: Federal, State, Local not specified - PPO

## 2018-02-04 DIAGNOSIS — R55 Syncope and collapse: Secondary | ICD-10-CM | POA: Diagnosis not present

## 2018-02-15 ENCOUNTER — Telehealth: Payer: Self-pay | Admitting: *Deleted

## 2018-02-15 NOTE — Telephone Encounter (Signed)
Call placed to the patient's wife, per dpr. [employer] sent in a monitor result that showed artifact and a few PAC's, per DOD. Results placed in the patient's box.  The wife stated that the patient recently started Fentanyl 75 mcg and Latuda 20 mg. She stated that the patient had not been tolerating the medication well. He has been pale and weak since the increase of the Fentanyl. The wife reduced the medication back down to 50 mcg but he is still not feeling well. She was unable to provide any blood pressures. The wife would like for Dr. Sallyanne Kuster to know and if there are any recommendations.

## 2018-02-15 NOTE — Telephone Encounter (Signed)
I am sorry that he is not feeling well.  I suspect there is not a real connection between his rhythm and his symptoms.  No serious arrhythmia has been detected.  Should review the pain management strategy with his oncologist. MCr

## 2018-02-16 NOTE — Telephone Encounter (Signed)
Patient's wife made aware of recommendations. They were on the way to the oncologist when we spoke.

## 2018-02-20 ENCOUNTER — Other Ambulatory Visit: Payer: Self-pay

## 2018-02-20 ENCOUNTER — Encounter (HOSPITAL_BASED_OUTPATIENT_CLINIC_OR_DEPARTMENT_OTHER): Payer: Self-pay | Admitting: Emergency Medicine

## 2018-02-20 ENCOUNTER — Emergency Department (HOSPITAL_BASED_OUTPATIENT_CLINIC_OR_DEPARTMENT_OTHER): Payer: Federal, State, Local not specified - PPO

## 2018-02-20 ENCOUNTER — Emergency Department (HOSPITAL_BASED_OUTPATIENT_CLINIC_OR_DEPARTMENT_OTHER)
Admission: EM | Admit: 2018-02-20 | Discharge: 2018-02-20 | Disposition: A | Discharge status: Home / Self Care | Payer: Federal, State, Local not specified - PPO | Attending: Emergency Medicine | Admitting: Emergency Medicine

## 2018-02-20 DIAGNOSIS — S0083XA Contusion of other part of head, initial encounter: Secondary | ICD-10-CM

## 2018-02-20 DIAGNOSIS — Y929 Unspecified place or not applicable: Secondary | ICD-10-CM | POA: Diagnosis not present

## 2018-02-20 DIAGNOSIS — Y999 Unspecified external cause status: Secondary | ICD-10-CM | POA: Diagnosis not present

## 2018-02-20 DIAGNOSIS — S80811A Abrasion, right lower leg, initial encounter: Secondary | ICD-10-CM | POA: Insufficient documentation

## 2018-02-20 DIAGNOSIS — Z7982 Long term (current) use of aspirin: Secondary | ICD-10-CM | POA: Diagnosis not present

## 2018-02-20 DIAGNOSIS — Z87891 Personal history of nicotine dependence: Secondary | ICD-10-CM | POA: Insufficient documentation

## 2018-02-20 DIAGNOSIS — S40811A Abrasion of right upper arm, initial encounter: Secondary | ICD-10-CM | POA: Insufficient documentation

## 2018-02-20 DIAGNOSIS — I252 Old myocardial infarction: Secondary | ICD-10-CM | POA: Insufficient documentation

## 2018-02-20 DIAGNOSIS — W1839XA Other fall on same level, initial encounter: Secondary | ICD-10-CM | POA: Diagnosis not present

## 2018-02-20 DIAGNOSIS — I10 Essential (primary) hypertension: Secondary | ICD-10-CM | POA: Insufficient documentation

## 2018-02-20 DIAGNOSIS — W19XXXA Unspecified fall, initial encounter: Secondary | ICD-10-CM

## 2018-02-20 DIAGNOSIS — C61 Malignant neoplasm of prostate: Secondary | ICD-10-CM | POA: Insufficient documentation

## 2018-02-20 DIAGNOSIS — Z79899 Other long term (current) drug therapy: Secondary | ICD-10-CM | POA: Diagnosis not present

## 2018-02-20 DIAGNOSIS — I2510 Atherosclerotic heart disease of native coronary artery without angina pectoris: Secondary | ICD-10-CM | POA: Diagnosis not present

## 2018-02-20 DIAGNOSIS — S098XXA Other specified injuries of head, initial encounter: Secondary | ICD-10-CM | POA: Diagnosis present

## 2018-02-20 DIAGNOSIS — M25551 Pain in right hip: Secondary | ICD-10-CM | POA: Insufficient documentation

## 2018-02-20 DIAGNOSIS — Y9389 Activity, other specified: Secondary | ICD-10-CM | POA: Insufficient documentation

## 2018-02-20 NOTE — ED Provider Notes (Signed)
Medical screening examination/treatment/procedure(s) were conducted as a shared visit with non-physician practitioner(s) and myself.  I personally evaluated the patient during the encounter.  None  Results for orders placed or performed during the hospital encounter of 11/08/16  Culture, blood (routine x 2)  Result Value Ref Range   Specimen Description BLOOD LEFT ANTECUBITAL    Special Requests BOTTLES DRAWN AEROBIC AND ANAEROBIC 10ML    Culture      NO GROWTH 5 DAYS Performed at [facility], [place] [address] [place], [place] [address]    Report Status 11/13/2016 FINAL   Culture, blood (routine x 2)  Result Value Ref Range   Specimen Description BLOOD RIGHT ANTECUBITAL    Special Requests BOTTLES DRAWN AEROBIC AND ANAEROBIC 5ML    Culture      NO GROWTH 5 DAYS Performed at [facility], [place] [address] [place],  [address]    Report Status 11/13/2016 FINAL   Comprehensive metabolic panel  Result Value Ref Range   Sodium 134 (L) 135 - 145 mmol/L   Potassium 3.8 3.5 - 5.1 mmol/L   Chloride 103 101 - 111 mmol/L   CO2 23 22 - 32 mmol/L   Glucose, Bld 125 (H) 65 - 99 mg/dL   BUN 19 6 - 20 mg/dL   Creatinine, Ser 0.82 0.61 - 1.24 mg/dL   Calcium 8.5 (L) 8.9 - 10.3 mg/dL   Total Protein 7.0 6.5 - 8.1 g/dL   Albumin 3.8 3.5 - 5.0 g/dL   AST 30 15 - 41 U/L   ALT 17 17 - 63 U/L   Alkaline Phosphatase 94 38 - 126 U/L   Total Bilirubin 0.5 0.3 - 1.2 mg/dL   GFR calc non Af Amer >60 >60 mL/min   GFR calc Af Amer >60 >60 mL/min   Anion gap 8 5 - 15  CBC with Differential  Result Value Ref Range   WBC 6.2 4.0 - 10.5 K/uL   RBC 4.99 4.22 - 5.81 MIL/uL   Hemoglobin 14.0 13.0 - 17.0 g/dL   HCT 42.2 39.0 - 52.0 %   MCV 84.6 78.0 - 100.0 fL   MCH 28.1 26.0 - 34.0 pg   MCHC 33.2 30.0 - 36.0 g/dL   RDW 14.1 11.5 - 15.5 %   Platelets 223 150 - 400 K/uL   Neutrophils Relative % 77 %   Neutro Abs 4.8 1.7 - 7.7 K/uL   Lymphocytes Relative 10 %   Lymphs Abs 0.6 (L) 0.7  - 4.0 K/uL   Monocytes Relative 9 %   Monocytes Absolute 0.6 0.1 - 1.0 K/uL   Eosinophils Relative 4 %   Eosinophils Absolute 0.2 0.0 - 0.7 K/uL   Basophils Relative 0 %   Basophils Absolute 0.0 0.0 - 0.1 K/uL  Urinalysis, Routine w reflex microscopic  Result Value Ref Range   Color, Urine YELLOW YELLOW   APPearance CLEAR CLEAR   Specific Gravity, Urine 1.012 1.005 - 1.030   pH 6.0 5.0 - 8.0   Glucose, UA NEGATIVE NEGATIVE mg/dL   Hgb urine dipstick NEGATIVE NEGATIVE   Bilirubin Urine NEGATIVE NEGATIVE   Ketones, ur NEGATIVE NEGATIVE mg/dL   Protein, ur NEGATIVE NEGATIVE mg/dL   Nitrite NEGATIVE NEGATIVE   Leukocytes, UA NEGATIVE NEGATIVE  Influenza panel by PCR (type A & B)  Result Value Ref Range   Influenza A By PCR NEGATIVE NEGATIVE   Influenza B By PCR POSITIVE (A) NEGATIVE  Lactic acid, plasma  Result Value  Ref Range   Lactic Acid, Venous 1.0 0.5 - 1.9 mmol/L  Lactic acid, plasma  Result Value Ref Range   Lactic Acid, Venous 1.2 0.5 - 1.9 mmol/L  Procalcitonin  Result Value Ref Range   Procalcitonin <0.10 ng/mL  Magnesium  Result Value Ref Range   Magnesium 1.8 1.7 - 2.4 mg/dL  Phosphorus  Result Value Ref Range   Phosphorus 2.4 (L) 2.5 - 4.6 mg/dL  TSH  Result Value Ref Range   TSH 0.791 0.350 - 4.500 uIU/mL  Comprehensive metabolic panel  Result Value Ref Range   Sodium 137 135 - 145 mmol/L   Potassium 3.7 3.5 - 5.1 mmol/L   Chloride 110 101 - 111 mmol/L   CO2 24 22 - 32 mmol/L   Glucose, Bld 133 (H) 65 - 99 mg/dL   BUN 15 6 - 20 mg/dL   Creatinine, Ser 0.76 0.61 - 1.24 mg/dL   Calcium 7.5 (L) 8.9 - 10.3 mg/dL   Total Protein 5.9 (L) 6.5 - 8.1 g/dL   Albumin 3.3 (L) 3.5 - 5.0 g/dL   AST 25 15 - 41 U/L   ALT 16 (L) 17 - 63 U/L   Alkaline Phosphatase 77 38 - 126 U/L   Total Bilirubin 0.3 0.3 - 1.2 mg/dL   GFR calc non Af Amer >60 >60 mL/min   GFR calc Af Amer >60 >60 mL/min   Anion gap 3 (L) 5 - 15  CBC  Result Value Ref Range   WBC 4.5 4.0 - 10.5  K/uL   RBC 4.39 4.22 - 5.81 MIL/uL   Hemoglobin 11.9 (L) 13.0 - 17.0 g/dL   HCT 36.5 (L) 39.0 - 52.0 %   MCV 83.1 78.0 - 100.0 fL   MCH 27.1 26.0 - 34.0 pg   MCHC 32.6 30.0 - 36.0 g/dL   RDW 13.9 11.5 - 15.5 %   Platelets 179 150 - 400 K/uL  I-Stat CG4 Lactic Acid, ED  Result Value Ref Range   Lactic Acid, Venous 2.39 (HH) 0.5 - 1.9 mmol/L   Comment NOTIFIED PHYSICIAN   I-Stat CG4 Lactic Acid, ED  (not at  [facility])  Result Value Ref Range   Lactic Acid, Venous 1.13 0.5 - 1.9 mmol/L   Dg Lumbar Spine Complete  Result Date: 02/20/2018 CLINICAL DATA:  Pain following fall.  Prostate carcinoma. EXAM: LUMBAR SPINE - COMPLETE 4+ VIEW COMPARISON:  None. FINDINGS: Frontal, lateral, spot lumbosacral lateral, and bilateral oblique views were obtained. There are 5 non rib-bearing lumbar type vertebral bodies there is lumbar dextroscoliosis. There is no fracture or spondylolisthesis. There is moderately severe disc space narrowing at L3-4, L4-5, and L5-S1. There is moderate disc space narrowing at L1-2. There is facet osteoarthritic change at L3-4, L4-5, and L5-S1 bilaterally. Widespread sclerotic bony metastatic disease noted. IMPRESSION: No fracture or spondylolisthesis. Multilevel arthropathy. Widespread sclerotic bony metastatic disease consistent with known prostate carcinoma. Electronically Signed   By: Lowella Grip III M.D.   On: 02/20/2018 12:56   Ct Head Wo Contrast  Result Date: 02/20/2018 CLINICAL DATA:  84 year old male with a history of fall EXAM: CT HEAD WITHOUT CONTRAST CT CERVICAL SPINE WITHOUT CONTRAST TECHNIQUE: Multidetector CT imaging of the head and cervical spine was performed following the standard protocol without intravenous contrast. Multiplanar CT image reconstructions of the cervical spine were also generated. COMPARISON:  04/27/2014 FINDINGS: CT HEAD FINDINGS Brain: No acute intracranial hemorrhage. No midline shift or mass effect. Unremarkable appearance of the ventricles.  Patchy hypodensity in  the periventricular white matter, unchanged from prior. Vascular: Calcifications of the anterior circulation Skull: No acute fracture. There are multiple sclerotic foci at the skull base involving clivus, bilateral mandible, and the left condyle Sinuses/Orbits: Minimal mucoperiosteal thickening of the left maxillary sinus. Other: None CT CERVICAL SPINE FINDINGS Alignment: Reversal of the normal cervical lordosis, with mild focal kyphosis centered at C5-C6. Trace anterolisthesis of C4 on C5. Facet alignment maintained. Skull base and vertebrae: No acute fracture identified. Multiple sclerotic foci throughout the cervical spine, including left C2 facet, body of C3, body of C4, body of C5, body of C6, body of C7, body of T1, posterior elements of C6, posterior elements of C7, transverse processes of T1 and T2. And posterior ribs on the right of 1 and 2 and on the left of 1 and 2. Soft tissues and spinal canal: No lymphadenopathy. No acute hemorrhage within the canal. Disc levels: Disc space narrowing is present throughout the cervical spine, most pronounced at the C5-C6 and C6-C7 levels with associated posterior disc osteophyte complex and bilateral uncovertebral joint disease. Varying degrees of foraminal narrowing throughout the cervical spine secondary to facet hypertrophy and uncovertebral joint disease, appears most prominent at C2-C3 on the right, bilateral at C3-C4, and on the right at C4-C5. Upper chest: Unremarkable appearance of the visualized lungs Other: None IMPRESSION: Head CT: No acute intracranial abnormality. Sclerotic metastases of the skull base and bilateral mandible. Cervical CT: No acute fracture malalignment of the cervical spine. Numerous sclerotic metastases of the cervical spine, upper thoracic spine and the bilateral first and second ribs. Multilevel degenerative disc disease. Electronically Signed   By: Corrie Mckusick D.O.   On: 02/20/2018 13:33   Ct Cervical Spine Wo  Contrast  Result Date: 02/20/2018 CLINICAL DATA:  84 year old male with a history of fall EXAM: CT HEAD WITHOUT CONTRAST CT CERVICAL SPINE WITHOUT CONTRAST TECHNIQUE: Multidetector CT imaging of the head and cervical spine was performed following the standard protocol without intravenous contrast. Multiplanar CT image reconstructions of the cervical spine were also generated. COMPARISON:  04/27/2014 FINDINGS: CT HEAD FINDINGS Brain: No acute intracranial hemorrhage. No midline shift or mass effect. Unremarkable appearance of the ventricles. Patchy hypodensity in the periventricular white matter, unchanged from prior. Vascular: Calcifications of the anterior circulation Skull: No acute fracture. There are multiple sclerotic foci at the skull base involving clivus, bilateral mandible, and the left condyle Sinuses/Orbits: Minimal mucoperiosteal thickening of the left maxillary sinus. Other: None CT CERVICAL SPINE FINDINGS Alignment: Reversal of the normal cervical lordosis, with mild focal kyphosis centered at C5-C6. Trace anterolisthesis of C4 on C5. Facet alignment maintained. Skull base and vertebrae: No acute fracture identified. Multiple sclerotic foci throughout the cervical spine, including left C2 facet, body of C3, body of C4, body of C5, body of C6, body of C7, body of T1, posterior elements of C6, posterior elements of C7, transverse processes of T1 and T2. And posterior ribs on the right of 1 and 2 and on the left of 1 and 2. Soft tissues and spinal canal: No lymphadenopathy. No acute hemorrhage within the canal. Disc levels: Disc space narrowing is present throughout the cervical spine, most pronounced at the C5-C6 and C6-C7 levels with associated posterior disc osteophyte complex and bilateral uncovertebral joint disease. Varying degrees of foraminal narrowing throughout the cervical spine secondary to facet hypertrophy and uncovertebral joint disease, appears most prominent at C2-C3 on the right,  bilateral at C3-C4, and on the right at C4-C5. Upper chest: Unremarkable appearance  of the visualized lungs Other: None IMPRESSION: Head CT: No acute intracranial abnormality. Sclerotic metastases of the skull base and bilateral mandible. Cervical CT: No acute fracture malalignment of the cervical spine. Numerous sclerotic metastases of the cervical spine, upper thoracic spine and the bilateral first and second ribs. Multilevel degenerative disc disease. Electronically Signed   By: Corrie Mckusick D.O.   On: 02/20/2018 13:33   Dg Hip Unilat W Or Wo Pelvis 2-3 Views Right  Result Date: 02/20/2018 CLINICAL DATA:  Pain following fall.  Known prostate carcinoma EXAM: DG HIP (WITH OR WITHOUT PELVIS) 2-3V RIGHT COMPARISON:  Dec 17, 2015 FINDINGS: Frontal pelvis as well as frontal and lateral right hip images were obtained. There is no evident fracture or dislocation. There is widespread sclerotic bony metastatic disease consistent with known prostate carcinoma. The hip joints and sacroiliac joints appear normal. There is degenerative change in the lower lumbar spine. IMPRESSION: Widespread sclerotic bony metastatic disease consistent with prostate carcinoma. Appearance similar to prior study. No fracture or dislocation evident. Electronically Signed   By: Lowella Grip III M.D.   On: 02/20/2018 12:54   Patient seen by me along with physician assistant.  Patient with a fall without loss of consciousness lost his balance and fell backwards landing mostly on his right side.  Patient has multiple abrasions also hit the right side of his head.  Patient is on aspirin not on other blood thinners.  Family member stated that patient was inappropriate for the first 18minutes but now is back to baseline.  Patient able to ambulate fine here.  X-rays a CT head neck without any acute findings.  Lumbar x-rays negative for any acute bony injury x-ray of right hip also negative for any acute bony injuries.  However pre-existing  patient is known to have fairly significant metastatic prostate cancer there is multiple metastasis throughout all of his x-rays and CTs.  Patient is followed at the [facility] clinic.  Patient will require treatment for the abrasions and skin tears.  Patient is stable for discharge home precautions provided if anything new or worse happens to come back.   Fredia Sorrow, MD 02/20/18 1420

## 2018-02-20 NOTE — ED Notes (Signed)
Bruised to right head from the fall but patient denies any pain to the head.

## 2018-02-20 NOTE — ED Provider Notes (Signed)
[facility] EMERGENCY DEPARTMENT Provider Note   CSN: [id number] Arrival date & time: 02/20/18  1102     History   Chief Complaint Chief Complaint  Patient presents with  . Fall    HPI Frank Petersen is a 84 y.o. male presenting with family after fall at 9:45 AM today.  Patient states that he was looking up at his gutters when he became off balance and fell striking the right side of his body.  Patient denies loss of consciousness.  Patient endorses pain to his right hip initially however denies pain in this area now.  Patient with 3 cm in diameter hematoma to the right temporal area of his scalp.  Patient denies headache.  Family states that patient was acting inappropriate shortly after fall but has now returned to baseline.  Patient with multiple new abrasions/skin tears to his upper and lower right sided extremities.  Patient denies all pain at this time and states that he is primarily just concerned with the hematoma on his scalp.  Patient takes 81 mg of aspirin daily.  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 Patient has fentanyl patch prescribed by PCP Patient tetanus is up-to-date per RN note. HPI  Past Medical History:  Diagnosis Date  . Anxiety   . Arthritis    "left shoulder" (08/01/2014)  . CAD (coronary artery disease)    s/p cath 08/02/2014 DES x 2 to RCA  . Chronic lower back pain   . Depression   . GERD (gastroesophageal reflux disease)   . Heart murmur   . High cholesterol    hx (08/01/2014)  . Hypertension    hx (08/01/2014)  . Migraine    "stopped ~ age 21" (08/01/2014)  . Prostate cancer metastatic to bone [facility])    "stage IV" (08/01/2014)  . Stented coronary artery 09/18/2014   OM 2 branch of left circumflex to 08/02/2014 with 2 drug-eluting stents (2.5 x 15 mm and 2.25 x 80mm [facility])  . Vertigo     Patient Active Problem List   Diagnosis Date Noted  . Sepsis ([place])  11/08/2016  . Influenza B 11/08/2016  . Dehydration 11/08/2016  . Prolonged QT interval 11/08/2016  . Mixed hyperlipidemia 09/18/2014  . CAD (coronary artery disease) 09/18/2014  . Stented coronary artery 09/18/2014  . Depression 09/18/2014  . NSTEMI (non-ST elevated myocardial infarction) ([place]) 08/02/2014  . Prostate cancer ([place])   . Heart murmur   . GERD (gastroesophageal reflux disease)   . Arthritis   . Vertigo     Past Surgical History:  Procedure Laterality Date  . LEFT HEART CATHETERIZATION WITH CORONARY ANGIOGRAM N/A 08/02/2014   Procedure: LEFT HEART CATHETERIZATION WITH CORONARY ANGIOGRAM;  Surgeon: Burnell Blanks, MD;  Location: [facility] CATH LAB;  Service: Cardiovascular;  Laterality: N/A;  . PERCUTANEOUS CORONARY STENT INTERVENTION (PCI-S)  08/02/2014   Procedure: PERCUTANEOUS CORONARY STENT INTERVENTION (PCI-S);  Surgeon: Burnell Blanks, MD;  Location: [facility] CATH LAB;  Service: Cardiovascular;;  . PILONIDAL CYST EXCISION  1960's  . PROSTATECTOMY  2007  . TONSILLECTOMY          Home Medications    Prior to Admission medications   Medication Sig Start Date End Date Taking? Authorizing Provider  aspirin EC 81 MG tablet Take 81 mg by mouth daily.   Yes [provider]  bismuth subsalicylate (PEPTO BISMOL) 262 MG chewable tablet Chew 524 mg by mouth as needed.   Yes [provider]  calcium-vitamin D (OSCAL WITH D) 500-200 MG-UNIT per tablet Take 1 tablet by mouth 2 (two) times daily.   Yes [provider]  Carboxymethylcellulose Sodium 0.25 % SOLN Apply to eye.   Yes [provider]  escitalopram (LEXAPRO) 10 MG tablet Take by mouth.   Yes [provider]  fentaNYL (DURAGESIC - DOSED MCG/HR) 75 MCG/HR Place 75 mcg onto the skin every other day.   Yes [provider]  fluticasone (FLONASE) 50 MCG/ACT nasal spray Place 1 spray into both nostrils daily as needed for allergies.  12/13/15  Yes [provider]  gabapentin (NEURONTIN) 300 MG capsule Take 300 mg by mouth 3 (three) times daily.  05/25/14  Yes [provider]  hydroxypropyl methylcellulose / hypromellose (ISOPTO TEARS / GONIOVISC) 2.5 % ophthalmic solution Place 2 drops into both eyes 3 (three) times daily as needed for dry eyes.   Yes [provider]  leuprolide, 6 Month, (ELIGARD) 45 MG injection Inject 45 mg into the skin every 6 (six) months.   Yes [provider]  loratadine (CLARITIN) 10 MG tablet Take 10 mg by mouth daily as needed for allergies.   Yes [provider]  naproxen (NAPROSYN) 500 MG tablet Take 500 mg by mouth daily as needed for mild pain.    Yes [provider]  omeprazole (PRILOSEC) 20 MG capsule Take 20 mg by mouth as needed.   Yes [provider]  oxyCODONE-acetaminophen (PERCOCET/ROXICET) 5-325 MG tablet Take 1 tablet by mouth every 6 (six) hours as needed for severe pain.   Yes [provider]  prochlorperazine (COMPAZINE) 10 MG tablet Take 10 mg by mouth every 6 (six) hours as needed for nausea or vomiting.   Yes [provider]  Vitamin D, Cholecalciferol, 1000 UNITS TABS Take 1,000 Units by mouth daily.   Yes [provider]  LORazepam (ATIVAN) 0.5 MG tablet Take 0.5 mg by mouth 2 (two) times daily as needed for anxiety.     [provider]  meclizine (ANTIVERT) 12.5 MG tablet Take 25 mg by mouth 3 (three) times daily as needed for dizziness.     [provider]    Family History Family History  Problem Relation Age of Onset  . Diabetes Mother   . CAD Father   . Diabetes Sister   . Breast cancer Sister   . CAD Brother     Social History Social History   Tobacco Use  . Smoking status: Former Smoker    Types: Cigarettes, Cigars  . Smokeless tobacco: Never Used  Substance Use Topics  . Alcohol use: Yes    Comment: 08/01/2014 "stopped drinking in ~ 2007; after rehab"  . Drug use: No      Allergies   Aspirin; Codeine; and Morphine   Review of Systems Review of Systems  Constitutional: Negative.  Negative for chills, fatigue and fever.  HENT: Negative.  Negative for congestion, ear pain, rhinorrhea, sore throat and trouble swallowing.   Eyes: Negative.  Negative for visual disturbance.  Respiratory: Negative.  Negative for cough, chest tightness and shortness of breath.   Cardiovascular: Negative.  Negative for chest pain and leg swelling.  Gastrointestinal: Negative.  Negative for abdominal pain, blood in stool, diarrhea, nausea and vomiting.  Genitourinary: Negative.  Negative for dysuria, flank pain, hematuria, scrotal swelling and testicular pain.  Musculoskeletal: Positive for back pain and myalgias. Negative for arthralgias and neck pain.  Skin: Positive for wound. Negative for rash.  Neurological: Negative.  Negative for dizziness, syncope, speech difficulty, weakness, light-headedness, numbness and headaches.  Hematological: Bruises/bleeds easily.     Physical Exam Updated Vital Signs BP 130/65 (BP Location: Right Arm)   Pulse 75   Temp 98.6 F (37 C) (Oral)   Resp 12   Ht 6' (1.829 m)   Wt 69.9 kg (154 lb)   SpO2 96%   BMI 20.89 kg/m   Physical Exam  Constitutional: He is oriented to person, place, and time. He does not appear ill. No distress.  Frail-appearing but in no acute distress  HENT:  Head: Head is without raccoon's eyes and without Battle's sign.    Right Ear: Ear canal normal. Tympanic membrane is perforated. No hemotympanum.  Left Ear: Tympanic membrane and ear canal normal. Tympanic membrane is not perforated. No hemotympanum.  Nose: Nose normal.  Mouth/Throat: Uvula is midline and oropharynx is clear and moist.  Patient with perforation to his right tympanic membrane, family states that this is old.  Eyes: Pupils are equal, round, and reactive to light. Conjunctivae, EOM and lids are normal.  Pupils equal round reactive to  light and accommodating, extraocular movements intact.  Neck: Trachea normal, normal range of motion, full passive range of motion without pain and phonation normal. Neck supple. No JVD present. No tracheal tenderness and no muscular tenderness present. No neck rigidity. No tracheal deviation present.  Patient denies neck pain.  Cardiovascular: Normal rate, regular rhythm, normal heart sounds, intact distal pulses and normal pulses.  Patient with implanted loop recorder.  Pulmonary/Chest: Effort normal and breath sounds normal. No accessory muscle usage. No respiratory distress. He exhibits no tenderness, no bony tenderness, no crepitus, no deformity and no swelling.  Abdominal: Soft. Normal appearance and bowel sounds are normal. There is no tenderness. There is no rigidity, no rebound and no guarding.  No signs of trauma to the abdomen.  No tenderness to palpation, guarding or rebound.  Genitourinary:  Genitourinary Comments: No signs of trauma.  Musculoskeletal:       Back:       Arms:      Legs: Hips stable, no crepitus, no pain on exam.  There is bilateral pedal edema, family states that this is not new for the patient and he is being followed up by his primary care provider for this concern.  Multiple right-sided skin tears and abrasions to the right upper and lower extremity.  No active bleeding.  Patient has full range of motion of all extremities without pain.  Patient has full strength and sensation in all extremities.  No midline spine TTP of the cervical, thoracic, lumbar, sacral spine. No paraspinal muscle tenderness, no deformity, crepitus, or step-off noted.  No signs of injury to the back.   Neurological: He is alert and oriented to person, place, and time. He has normal strength. No cranial nerve deficit or sensory deficit. He displays a negative Romberg sign.  Patient is hard of hearing. Mental Status:  Alert, thought content appropriate, able to give a coherent history.  Speech fluent without evidence of aphasia. Able to follow 2 step commands without difficulty.  Cranial Nerves:  II:  Peripheral visual fields grossly normal, pupils equal, round, reactive to light III,IV, VI: ptosis not present, extra-ocular motions intact bilaterally  V,VII: smile symmetric, facial light touch sensation equal VIII: hearing grossly normal to voice  X: uvula elevates symmetrically  XI: bilateral shoulder shrug symmetric and strong XII: midline tongue extension without fassiculations Motor:  Normal tone.  5/5 strength of BUE and BLE major muscle groups including strong and equal grip strength and dorsiflexion/plantar flexion Sensory: light touch normal in all extremities. Cerebellar: normal finger-to-nose with bilateral upper extremities CV: 2+ radial and DP/PT pulses   Skin: Skin is warm and dry. Abrasion noted.  Patient with multiple new skin abrasions/skin tears to his bilateral upper and lower extremities.  Patient has full range of motion to his upper and lower extremities without pain. Patient has multiple older abrasions and skin tears on his upper and lower extremities, patient's family states that these are from his radiation infusion treatments and patient now has very fragile skin that bruises and tears easily.     ED Treatments / Results  Labs (all labs ordered are listed, but only abnormal results are displayed) Labs Reviewed - No data to display  EKG None  Radiology Dg Lumbar Spine Complete  Result Date: 02/20/2018 CLINICAL DATA:  Pain following fall.  Prostate carcinoma. EXAM: LUMBAR SPINE - COMPLETE 4+ VIEW COMPARISON:  None. FINDINGS: Frontal, lateral, spot lumbosacral lateral, and bilateral oblique views were obtained. There are 5 non rib-bearing lumbar type vertebral bodies there is lumbar dextroscoliosis. There is no fracture or spondylolisthesis. There is moderately severe disc space narrowing at L3-4, L4-5, and L5-S1. There is moderate disc space  narrowing at L1-2. There is facet osteoarthritic change at L3-4, L4-5, and L5-S1 bilaterally. Widespread sclerotic bony metastatic disease noted. IMPRESSION: No fracture or spondylolisthesis. Multilevel arthropathy. Widespread sclerotic bony metastatic disease consistent with known prostate carcinoma. Electronically Signed   By: Lowella Grip III M.D.   On: 02/20/2018 12:56   Ct Head Wo Contrast  Result Date: 02/20/2018 CLINICAL DATA:  84 year old male with a history of fall EXAM: CT HEAD WITHOUT CONTRAST CT CERVICAL SPINE WITHOUT CONTRAST TECHNIQUE: Multidetector CT imaging of the head and cervical spine was performed following the standard protocol without intravenous contrast. Multiplanar CT image reconstructions of the cervical spine were also generated. COMPARISON:  04/27/2014 FINDINGS: CT HEAD FINDINGS Brain: No acute intracranial hemorrhage. No midline shift or mass effect. Unremarkable appearance of the ventricles. Patchy hypodensity in the periventricular white matter, unchanged from prior. Vascular: Calcifications of the anterior circulation Skull: No acute fracture. There are multiple sclerotic foci at the skull base involving clivus, bilateral mandible, and the left condyle Sinuses/Orbits: Minimal mucoperiosteal thickening of the left maxillary sinus. Other: None CT CERVICAL SPINE FINDINGS Alignment: Reversal of the normal cervical lordosis, with mild focal kyphosis centered at C5-C6. Trace anterolisthesis of C4 on C5. Facet alignment maintained. Skull base and vertebrae: No acute fracture identified. Multiple sclerotic foci throughout the cervical spine, including left C2 facet, body of C3, body of C4, body of C5, body of C6, body of C7, body of T1, posterior elements of C6, posterior elements of C7, transverse processes of T1 and T2. And posterior ribs on the right of 1 and 2 and on the left of 1 and 2. Soft tissues and spinal canal: No lymphadenopathy. No acute hemorrhage within the canal. Disc  levels: Disc space narrowing is present throughout the cervical spine, most pronounced at the C5-C6 and C6-C7 levels with associated posterior disc osteophyte complex and bilateral uncovertebral joint disease. Varying degrees of foraminal narrowing throughout the cervical spine secondary to facet hypertrophy and uncovertebral joint disease, appears most prominent at C2-C3 on the right, bilateral at C3-C4, and on the right at C4-C5. Upper chest: Unremarkable appearance of the visualized lungs Other: None IMPRESSION: Head CT: No acute intracranial abnormality. Sclerotic metastases  of the skull base and bilateral mandible. Cervical CT: No acute fracture malalignment of the cervical spine. Numerous sclerotic metastases of the cervical spine, upper thoracic spine and the bilateral first and second ribs. Multilevel degenerative disc disease. Electronically Signed   By: Corrie Mckusick D.O.   On: 02/20/2018 13:33   Ct Cervical Spine Wo Contrast  Result Date: 02/20/2018 CLINICAL DATA:  84 year old male with a history of fall EXAM: CT HEAD WITHOUT CONTRAST CT CERVICAL SPINE WITHOUT CONTRAST TECHNIQUE: Multidetector CT imaging of the head and cervical spine was performed following the standard protocol without intravenous contrast. Multiplanar CT image reconstructions of the cervical spine were also generated. COMPARISON:  04/27/2014 FINDINGS: CT HEAD FINDINGS Brain: No acute intracranial hemorrhage. No midline shift or mass effect. Unremarkable appearance of the ventricles. Patchy hypodensity in the periventricular white matter, unchanged from prior. Vascular: Calcifications of the anterior circulation Skull: No acute fracture. There are multiple sclerotic foci at the skull base involving clivus, bilateral mandible, and the left condyle Sinuses/Orbits: Minimal mucoperiosteal thickening of the left maxillary sinus. Other: None CT CERVICAL SPINE FINDINGS Alignment: Reversal of the normal cervical lordosis, with mild focal  kyphosis centered at C5-C6. Trace anterolisthesis of C4 on C5. Facet alignment maintained. Skull base and vertebrae: No acute fracture identified. Multiple sclerotic foci throughout the cervical spine, including left C2 facet, body of C3, body of C4, body of C5, body of C6, body of C7, body of T1, posterior elements of C6, posterior elements of C7, transverse processes of T1 and T2. And posterior ribs on the right of 1 and 2 and on the left of 1 and 2. Soft tissues and spinal canal: No lymphadenopathy. No acute hemorrhage within the canal. Disc levels: Disc space narrowing is present throughout the cervical spine, most pronounced at the C5-C6 and C6-C7 levels with associated posterior disc osteophyte complex and bilateral uncovertebral joint disease. Varying degrees of foraminal narrowing throughout the cervical spine secondary to facet hypertrophy and uncovertebral joint disease, appears most prominent at C2-C3 on the right, bilateral at C3-C4, and on the right at C4-C5. Upper chest: Unremarkable appearance of the visualized lungs Other: None IMPRESSION: Head CT: No acute intracranial abnormality. Sclerotic metastases of the skull base and bilateral mandible. Cervical CT: No acute fracture malalignment of the cervical spine. Numerous sclerotic metastases of the cervical spine, upper thoracic spine and the bilateral first and second ribs. Multilevel degenerative disc disease. Electronically Signed   By: Corrie Mckusick D.O.   On: 02/20/2018 13:33   Dg Hip Unilat W Or Wo Pelvis 2-3 Views Right  Result Date: 02/20/2018 CLINICAL DATA:  Pain following fall.  Known prostate carcinoma EXAM: DG HIP (WITH OR WITHOUT PELVIS) 2-3V RIGHT COMPARISON:  Dec 17, 2015 FINDINGS: Frontal pelvis as well as frontal and lateral right hip images were obtained. There is no evident fracture or dislocation. There is widespread sclerotic bony metastatic disease consistent with known prostate carcinoma. The hip joints and sacroiliac joints  appear normal. There is degenerative change in the lower lumbar spine. IMPRESSION: Widespread sclerotic bony metastatic disease consistent with prostate carcinoma. Appearance similar to prior study. No fracture or dislocation evident. Electronically Signed   By: Lowella Grip III M.D.   On: 02/20/2018 12:54    Procedures Procedures (including critical care time)  Medications Ordered in ED Medications - No data to display   Initial Impression / Assessment and Plan / ED Course  I have reviewed the triage vital signs and the nursing notes.  Pertinent  labs & imaging results that were available during my care of the patient were reviewed by me and considered in my medical decision making (see chart for details).    Imaging negative for acute fractures.  Patient with mechanical fall and hematoma to the right temporal area as well as multiple skin tears/abrasions.  No neuro deficits, normal neuro exam.  Patient denies pain at this time and states willingness for discharge.  Patient to be discharged with follow-up with his primary care provider.  Patient's multiple skin tears treated by nursing staff with bacitracin ointment and sterile bandages.  At this time there does not appear to be any evidence of an acute emergency medical condition and the patient appears stable for discharge with appropriate outpatient follow up. Diagnosis was discussed with patient who verbalizes understanding and is agreeable to discharge. I have discussed return precautions with patient and family who verbalize understanding of return precautions. Patient strongly encouraged to follow-up with their PCP.  Patient's case discussed with and patient seen by Dr. Rogene Houston who agrees with plan to discharge with follow-up.     This note was dictated using DragonOne dictation software; please contact for any inconsistencies within the note.  Final Clinical Impressions(s) / ED Diagnoses   Final diagnoses:  Fall, initial  encounter  Contusion of face, initial encounter    ED Discharge Orders    None       Gari Crown 02/20/18 1943    Fredia Sorrow, MD 02/24/18 [phone]

## 2018-02-20 NOTE — Discharge Instructions (Addendum)
Please return to the emergency department for any new or worsening symptoms. Please follow-up with your primary care provider regarding your visit today.  Get help right away if: Your pain gets worse. Your pain is not controlled with medicine. You have a fever. Your puffiness gets worse. Your skin turns more blue or yellow. Your skin over the hematoma breaks or starts bleeding. You are short of breath, feel weak, or have a change in consciousness. Your hematoma is on your scalp and you have a headache that gets worse or a change in alertness or consciousness.

## 2018-02-20 NOTE — ED Triage Notes (Addendum)
Pt tripped and fell in the driveway this morning. Pt has multiple abrasions to arms and legs. Also has a contusion noted to R side of his head. Family states he "acted inappropriate" for approximately 5 min after the fall but is back to baseline now.

## 2018-02-23 ENCOUNTER — Other Ambulatory Visit: Payer: Self-pay

## 2018-02-23 ENCOUNTER — Emergency Department (INDEPENDENT_AMBULATORY_CARE_PROVIDER_SITE_OTHER)
Admission: EM | Admit: 2018-02-23 | Discharge: 2018-02-23 | Disposition: A | Discharge status: Home / Self Care | Payer: Federal, State, Local not specified - PPO | Source: Home / Self Care | Attending: Family Medicine | Admitting: Family Medicine

## 2018-02-23 DIAGNOSIS — L089 Local infection of the skin and subcutaneous tissue, unspecified: Secondary | ICD-10-CM | POA: Diagnosis not present

## 2018-02-23 DIAGNOSIS — S80812A Abrasion, left lower leg, initial encounter: Secondary | ICD-10-CM

## 2018-02-23 DIAGNOSIS — R600 Localized edema: Secondary | ICD-10-CM

## 2018-02-23 MED ORDER — DOXYCYCLINE HYCLATE 100 MG PO CAPS: 100.0000 mg | ORAL_CAPSULE | Freq: Two times a day (BID) | ORAL | 0 refills | Status: DC

## 2018-02-23 NOTE — Discharge Instructions (Signed)
°  It is recommended that you keep your wounds clean with warm water and mild soap. Please take the antibiotic, doxycycline, as prescribed to help treat the infected wound on your Left lower leg.  If symptoms not improving in 4-5 days, please be reevaluated by a medical provider. You may benefit from seeing a wound specialist to help treat the multiple wounds on your lower legs.  The wound center may also help with lower leg swelling.   Please follow up with family medicine and oncology for ongoing healthcare needs including the leg swelling to help determine the source and for ongoing treatment.

## 2018-02-23 NOTE — ED Triage Notes (Signed)
Pt has a wound on left shin that wife is concerned about the healing process.  Area is about 1.5 inches long, and 1/2 inch wide.

## 2018-02-23 NOTE — ED Provider Notes (Signed)
[name]    CSN: [id number] Arrival date & time: 02/23/18  1345     History   Chief Complaint Chief Complaint  Patient presents with  . Leg Injury    HPI Frank Petersen is a 84 y.o. male.   HPI Frank Petersen is a 84 y.o. male presenting to UC accompanied by his wife with c/o of worsening wound to his Left lower leg. He was seen in the emergency department for a trip and fall on 02/20/18.   He sustained multiple wounds due to fragile skin.  Today, he noticed redness, swelling and pus coming out of a wound on his Left shin.  Pain is mild, sore, worse to touch. No fever or chills. Wife also notes pt has had bilateral leg swelling for the last 3-4 weeks.  She states she mentioned it to his oncologist but they did not address her concerns.  Pt denies chest pain or SOB. No worsening of leg swelling since the fall.    Past Medical History:  Diagnosis Date  . Anxiety   . Arthritis    "left shoulder" (08/01/2014)  . CAD (coronary artery disease)    s/p cath 08/02/2014 DES x 2 to RCA  . Chronic lower back pain   . Depression   . GERD (gastroesophageal reflux disease)   . Heart murmur   . High cholesterol    hx (08/01/2014)  . Hypertension    hx (08/01/2014)  . Migraine    "stopped ~ age 22" (08/01/2014)  . Prostate cancer metastatic to bone [facility])    "stage IV" (08/01/2014)  . Stented coronary artery 09/18/2014   OM 2 branch of left circumflex to 08/02/2014 with 2 drug-eluting stents (2.5 x 15 mm and 2.25 x 19mm Xience Alpine)  . Vertigo     Patient Active Problem List   Diagnosis Date Noted  . Sepsis ([place]) 11/08/2016  . Influenza B 11/08/2016  . Dehydration 11/08/2016  . Prolonged QT interval 11/08/2016  . Mixed hyperlipidemia 09/18/2014  . CAD (coronary artery disease) 09/18/2014  . Stented coronary artery 09/18/2014  . Depression 09/18/2014  . NSTEMI (non-ST elevated myocardial infarction) ([place]) 08/02/2014  . Prostate cancer ([place])   . Heart murmur     . GERD (gastroesophageal reflux disease)   . Arthritis   . Vertigo     Past Surgical History:  Procedure Laterality Date  . LEFT HEART CATHETERIZATION WITH CORONARY ANGIOGRAM N/A 08/02/2014   Procedure: LEFT HEART CATHETERIZATION WITH CORONARY ANGIOGRAM;  Surgeon: Burnell Blanks, MD;  Location: [facility] CATH LAB;  Service: Cardiovascular;  Laterality: N/A;  . PERCUTANEOUS CORONARY STENT INTERVENTION (PCI-S)  08/02/2014   Procedure: PERCUTANEOUS CORONARY STENT INTERVENTION (PCI-S);  Surgeon: Burnell Blanks, MD;  Location: [facility] CATH LAB;  Service: Cardiovascular;;  . PILONIDAL CYST EXCISION  1960's  . PROSTATECTOMY  2007  . TONSILLECTOMY         Home Medications    Prior to Admission medications   Medication Sig Start Date End Date Taking? Authorizing Provider  aspirin EC 81 MG tablet Take 81 mg by mouth daily.    [provider]  bismuth subsalicylate (PEPTO BISMOL) 262 MG chewable tablet Chew 524 mg by mouth as needed.    [provider]  calcium-vitamin D (OSCAL WITH D) 500-200 MG-UNIT per tablet Take 1 tablet by mouth 2 (two) times daily.    [provider]  Carboxymethylcellulose Sodium 0.25 % SOLN Apply to eye.    [provider]  doxycycline (VIBRAMYCIN) 100 MG capsule Take 1 capsule (100 mg total) by mouth 2 (two) times daily. One po bid x 7 days 02/23/18   Noe Gens, PA-C  escitalopram (LEXAPRO) 10 MG tablet Take by mouth.    [provider]  fentaNYL (DURAGESIC - DOSED MCG/HR) 75 MCG/HR Place 75 mcg onto the skin every other day.    [provider]  fluticasone (FLONASE) 50 MCG/ACT nasal spray Place 1 spray into both nostrils daily as needed for allergies.  12/13/15   [provider]  gabapentin (NEURONTIN) 300 MG capsule Take 300 mg by mouth 3 (three) times daily.  05/25/14   [provider]  hydroxypropyl methylcellulose / hypromellose (ISOPTO TEARS / GONIOVISC) 2.5 % ophthalmic solution Place  2 drops into both eyes 3 (three) times daily as needed for dry eyes.    [provider]  leuprolide, 6 Month, (ELIGARD) 45 MG injection Inject 45 mg into the skin every 6 (six) months.    [provider]  loratadine (CLARITIN) 10 MG tablet Take 10 mg by mouth daily as needed for allergies.    [provider]  LORazepam (ATIVAN) 0.5 MG tablet Take 0.5 mg by mouth 2 (two) times daily as needed for anxiety.     [provider]  meclizine (ANTIVERT) 12.5 MG tablet Take 25 mg by mouth 3 (three) times daily as needed for dizziness.     [provider]  naproxen (NAPROSYN) 500 MG tablet Take 500 mg by mouth daily as needed for mild pain.     [provider]  omeprazole (PRILOSEC) 20 MG capsule Take 20 mg by mouth as needed.    [provider]  oxyCODONE-acetaminophen (PERCOCET/ROXICET) 5-325 MG tablet Take 1 tablet by mouth every 6 (six) hours as needed for severe pain.    [provider]  prochlorperazine (COMPAZINE) 10 MG tablet Take 10 mg by mouth every 6 (six) hours as needed for nausea or vomiting.    [provider]  Vitamin D, Cholecalciferol, 1000 UNITS TABS Take 1,000 Units by mouth daily.    [provider]    Family History Family History  Problem Relation Age of Onset  . Diabetes Mother   . CAD Father   . Diabetes Sister   . Breast cancer Sister   . CAD Brother     Social History Social History   Tobacco Use  . Smoking status: Former Smoker    Types: Cigarettes, Cigars  . Smokeless tobacco: Never Used  Substance Use Topics  . Alcohol use: Yes    Comment: 08/01/2014 "stopped drinking in ~ 2007; after rehab"  . Drug use: No     Allergies   Aspirin; Codeine; and Morphine   Review of Systems Review of Systems  Cardiovascular: Positive for leg swelling. Negative for chest pain and palpitations.  Skin: Positive for color change and wound.     Physical Exam Triage Vital Signs ED  Triage Vitals  Enc Vitals Group     BP 02/23/18 1418 112/62     Pulse Rate 02/23/18 1418 76     Resp --      Temp 02/23/18 1418 97.9 F (36.6 C)     Temp Source 02/23/18 1418 Oral     SpO2 02/23/18 1418 100 %     Weight 02/23/18 1420 149 lb (67.6 kg)     Height 02/23/18 1420 6' (1.829 m)     Head Circumference --  Peak Flow --      Pain Score 02/23/18 1419 3     Pain Loc --      Pain Edu? --      Excl. in [place]? --    No data found.  Updated Vital Signs BP 112/62 (BP Location: Right Arm)   Pulse 76   Temp 97.9 F (36.6 C) (Oral)   Ht 6' (1.829 m)   Wt 149 lb (67.6 kg)   SpO2 100%   BMI 20.21 kg/m   Visual Acuity Right Eye Distance:   Left Eye Distance:   Bilateral Distance:    Right Eye Near:   Left Eye Near:    Bilateral Near:     Physical Exam  Constitutional: He is oriented to person, place, and time. He appears well-developed and well-nourished.  HENT:  Head: Normocephalic and atraumatic.  Eyes: EOM are normal.  Neck: Normal range of motion.  Cardiovascular: Normal rate.  Pulmonary/Chest: Effort normal.  Musculoskeletal: Normal range of motion. He exhibits edema and tenderness.  Bilateral legs: 2+ pitting edema.  Left lower leg: mild tenderness (see skin exam), calf is soft. Full ROM knee and ankle.   Neurological: He is alert and oriented to person, place, and time.  Skin: Skin is warm and dry. Abrasion noted.     Multiple abrasions on arms and legs Left lower leg, medial aspect: 2cm superficial abrasion with 2-3cm surrounding erythema. Tender. Scant purulent discharge from center of abrasion. No induration or fluctuance.   Psychiatric: He has a normal mood and affect. His behavior is normal.  Nursing note and vitals reviewed.    UC Treatments / Results  Labs (all labs ordered are listed, but only abnormal results are displayed) Labs Reviewed - No data to display  EKG None  Radiology No results found.  Procedures Procedures (including  critical care time)  Medications Ordered in UC Medications - No data to display  Initial Impression / Assessment and Plan / UC Course  I have reviewed the triage vital signs and the nursing notes.  Pertinent labs & imaging results that were available during my care of the patient were reviewed by me and considered in my medical decision making (see chart for details).     Wound on Left lower leg appears infected. Will tx with doxycycline. Bilateral leg swelling appears chronic. Encouraged f/u with PCP  Final Clinical Impressions(s) / UC Diagnoses   Final diagnoses:  Infected abrasion of lower leg, left, initial encounter  Bilateral leg edema     Discharge Instructions      It is recommended that you keep your wounds clean with warm water and mild soap. Please take the antibiotic, doxycycline, as prescribed to help treat the infected wound on your Left lower leg.  If symptoms not improving in 4-5 days, please be reevaluated by a medical provider. You may benefit from seeing a wound specialist to help treat the multiple wounds on your lower legs.  The wound center may also help with lower leg swelling.   Please follow up with family medicine and oncology for ongoing healthcare needs including the leg swelling to help determine the source and for ongoing treatment.     ED Prescriptions    Medication Sig Dispense Auth. Provider   doxycycline (VIBRAMYCIN) 100 MG capsule Take 1 capsule (100 mg total) by mouth 2 (two) times daily. One po bid x 7 days 14 capsule Noe Gens, [place]     Controlled Substance Prescriptions [place] Controlled Substance  Registry consulted? Not Applicable   Tyrell Antonio 02/24/18 [id number]

## 2018-02-25 ENCOUNTER — Telehealth: Payer: Self-pay

## 2018-02-25 NOTE — Telephone Encounter (Signed)
Spoke with wife, feels that leg looks about the same, maybe a little better.  Will follow up as needed.

## 2018-03-04 ENCOUNTER — Telehealth: Payer: Self-pay | Admitting: Cardiovascular Disease

## 2018-03-04 NOTE — Telephone Encounter (Signed)
Called patient's wife (DPR) back. She just wanted to let our office know monitor was sent back. Informed patient once Dr. Sallyanne Kuster gets results back his nurse will give them a call. Patient's wife verbalized understanding and appreciated the call back.

## 2018-03-04 NOTE — Telephone Encounter (Signed)
New message   Patient's spouse calling to report they removed the event monitor after 3 weeks. Spouse states  She has sent monitor back.

## 2018-03-09 NOTE — Telephone Encounter (Signed)
Still waiting to get this report, any idea where it is?

## 2018-03-11 NOTE — Telephone Encounter (Signed)
MD reviewed results with Frank Petersen per result note on 03/10/18

## 2018-03-29 ENCOUNTER — Ambulatory Visit (INDEPENDENT_AMBULATORY_CARE_PROVIDER_SITE_OTHER): Payer: Federal, State, Local not specified - PPO | Admitting: Adult Health

## 2018-03-29 ENCOUNTER — Encounter: Payer: Self-pay | Admitting: Adult Health

## 2018-03-29 ENCOUNTER — Telehealth: Payer: Self-pay | Admitting: Cardiovascular Disease

## 2018-03-29 VITALS — BP 105/59 | HR 69 | Ht 73.0 in | Wt 140.8 lb

## 2018-03-29 DIAGNOSIS — I2510 Atherosclerotic heart disease of native coronary artery without angina pectoris: Secondary | ICD-10-CM | POA: Diagnosis not present

## 2018-03-29 DIAGNOSIS — Z955 Presence of coronary angioplasty implant and graft: Secondary | ICD-10-CM

## 2018-03-29 DIAGNOSIS — R0782 Intercostal pain: Secondary | ICD-10-CM | POA: Diagnosis not present

## 2018-03-29 DIAGNOSIS — C61 Malignant neoplasm of prostate: Secondary | ICD-10-CM

## 2018-03-29 NOTE — Progress Notes (Signed)
Cardiology Office Note   Date:  03/29/2018   ID:  Frank Petersen, DOB December 09, 1932, MRN [id number]  PCP:  Norberta Keens, FNP  Cardiologist: Croitoru    Chief Complaint  Patient presents with  . Follow-up  . Shortness of Breath  . Chest Pain    tightness  . Leg Pain  . Edema    in legs.      History of Present Illness: Frank Petersen is a 84 y.o. male who presents for ongoing assessment and management of CAD (NSTEMI with 99% second OM, DES x in 2015), hyperlipidemia, metastatic prostate CA with bone mets and other wide spread metastasis. , irregular heart rhythm. They called our office this am to added on to my schedule due to complaints of chest pressure and palpitations, uneasy feeling in his chest. He is followed by Hospice but was told to come to the office by Hospice nurse. He has other history of anxiety and dyspnea.   He has not been taking medications for pain, or anxiety of 3 days. He states he has become more anxious and is having more pain, burning in his chest and pain in his legs. His wife is very attentive and cares for him along with Hospice. He states his pain is not controlled with Fentanyl alone. He has been fighting taking more medication for pain to stay lucid.   Past Medical History:  Diagnosis Date  . Anxiety   . Arthritis    "left shoulder" (08/01/2014)  . CAD (coronary artery disease)    s/p cath 08/02/2014 DES x 2 to RCA  . Chronic lower back pain   . Depression   . GERD (gastroesophageal reflux disease)   . Heart murmur   . High cholesterol    hx (08/01/2014)  . Hypertension    hx (08/01/2014)  . Migraine    "stopped ~ age 72" (08/01/2014)  . Prostate cancer metastatic to bone [facility])    "stage IV" (08/01/2014)  . Stented coronary artery 09/18/2014   OM 2 branch of left circumflex to 08/02/2014 with 2 drug-eluting stents (2.5 x 15 mm and 2.25 x 74mm Xience Alpine)  . Vertigo     Past Surgical History:  Procedure Laterality Date  . LEFT HEART  CATHETERIZATION WITH CORONARY ANGIOGRAM N/A 08/02/2014   Procedure: LEFT HEART CATHETERIZATION WITH CORONARY ANGIOGRAM;  Surgeon: Burnell Blanks, MD;  Location: [facility] CATH LAB;  Service: Cardiovascular;  Laterality: N/A;  . PERCUTANEOUS CORONARY STENT INTERVENTION (PCI-S)  08/02/2014   Procedure: PERCUTANEOUS CORONARY STENT INTERVENTION (PCI-S);  Surgeon: Burnell Blanks, MD;  Location: [facility] CATH LAB;  Service: Cardiovascular;;  . PILONIDAL CYST EXCISION  1960's  . PROSTATECTOMY  2007  . TONSILLECTOMY       Current Outpatient Medications  Medication Sig Dispense Refill  . bismuth subsalicylate (PEPTO BISMOL) 262 MG chewable tablet Chew 524 mg by mouth as needed.    . Carboxymethylcellulose Sodium 0.25 % SOLN Apply to eye.    . escitalopram (LEXAPRO) 10 MG tablet Take by mouth.    . fentaNYL (DURAGESIC - DOSED MCG/HR) 75 MCG/HR Place 75 mcg onto the skin every other day.    . fluticasone (FLONASE) 50 MCG/ACT nasal spray Place 1 spray into both nostrils daily as needed for allergies.     Marland Kitchen gabapentin (NEURONTIN) 300 MG capsule Take 300 mg by mouth 3 (three) times daily.     . hydroxypropyl methylcellulose / hypromellose (ISOPTO TEARS / GONIOVISC) 2.5 % ophthalmic solution Place 2  drops into both eyes 3 (three) times daily as needed for dry eyes.    Marland Kitchen loratadine (CLARITIN) 10 MG tablet Take 10 mg by mouth daily as needed for allergies.    Marland Kitchen LORazepam (ATIVAN) 0.5 MG tablet Take 0.5 mg by mouth 2 (two) times daily as needed for anxiety.     . meclizine (ANTIVERT) 12.5 MG tablet Take 25 mg by mouth 3 (three) times daily as needed for dizziness.     . naproxen (NAPROSYN) 500 MG tablet Take 500 mg by mouth daily as needed for mild pain.     Marland Kitchen omeprazole (PRILOSEC) 20 MG capsule Take 20 mg by mouth daily as needed.    Marland Kitchen oxyCODONE-acetaminophen (PERCOCET/ROXICET) 5-325 MG tablet Take 1 tablet by mouth every 6 (six) hours as needed for severe pain.    Marland Kitchen prochlorperazine (COMPAZINE) 10 MG  tablet Take 10 mg by mouth every 6 (six) hours as needed for nausea or vomiting.    Orlie Dakin Sodium (SENNA-DOCUSATE SODIUM PO) Take by mouth.     No current facility-administered medications for this visit.     Allergies:   Aspirin; Codeine; and Morphine    Social History:  The patient  reports that he has quit smoking. His smoking use included cigarettes and cigars. He has never used smokeless tobacco. He reports that he drinks alcohol. He reports that he does not use drugs.   Family History:  The patient's family history includes Breast cancer in his sister; CAD in his brother and father; Diabetes in his mother and sister.    ROS: All other systems are reviewed and negative. Unless otherwise mentioned in H&P    PHYSICAL EXAM: VS:  BP (!) 105/59   Pulse 69   Ht 6\' 1"  (1.854 m)   Wt 140 lb 12.8 oz (63.9 kg)   BMI 18.58 kg/m  , BMI Body mass index is 18.58 kg/m. GEN: Well nourished, well developed, in no acute distress, very frail  HEENT: normal  Neck: no JVD, carotid bruits, or masses Cardiac:IRRR; no murmurs, rubs, or gallops,no edema  Respiratory:  clear to auscultation bilaterally, normal work of breathing GI: soft, nontender, nondistended, + BS MS: no deformity or atrophy  Skin: warm and dry, no rash Neuro:  Strength and sensation are intact Psych: euthymic mood, full affect, anxious.    EKG:  Sinus rhythm with 1st degree AV block and frequent PAC's.   Recent Labs: No results found for requested labs within last 8760 hours.    Lipid Panel    Component Value Date/Time   CHOL 278 (H) 08/02/2014 0245   TRIG 270 (H) 08/02/2014 0245   HDL 37 (L) 08/02/2014 0245   CHOLHDL 7.5 08/02/2014 0245   VLDL 54 (H) 08/02/2014 0245   LDLCALC 187 (H) 08/02/2014 0245      Wt Readings from Last 3 Encounters:  03/29/18 140 lb 12.8 oz (63.9 kg)  02/23/18 149 lb (67.6 kg)  02/20/18 154 lb (69.9 kg)      Other studies Reviewed: None   ASSESSMENT AND  PLAN:  1. Chest pressure: On further evaluation he is having bone mets pain, as metastasis has gone in to ribs and to pelvis and legs. He has been refusing pain medications and has not taken any antianxiety medications. I have talked with him about staying ahead of the pain and taking the oxycodone around the clock for ongoing pain control. He is to take antianxiety medications as needed to assist with his anxiety. He admits  that he feels stressed all the time due to his pain.   I have recommended that he speak with Hospice concerning his pain control to help alleviate his symptoms without causing significant lethargy. He is willing to take pain control and antianxiety medications as needed.   2. GERD Symptoms: He is on Protonix 20 mg BID. Consider increasing the dose if pain is not controlled by oxycodone.   3. CAD: Continue medications as directed. He is on Hospice therefore no testing is planned at this time.   Current medicines are reviewed at length with the patient today.    Labs/ tests ordered today include: None   Phill Myron. West Pugh, ANP, AACC   03/29/2018 12:36 PM    [facility] [address], [place], [place] [address] Phone: [phone]; Fax: [phone]

## 2018-03-29 NOTE — Telephone Encounter (Signed)
SPOKE TO WIFE . SHE STATE PATIENT  CONTINUE TO STATES HE HAS UNEASY FEELING AND THICKNESS IN CHEST.   SHE STATES THIS HAS OCCURRED  FOR THE LAST FOUR DAYS. WIFE STATES THE PATIENT IS A POOR  HISTORIAN AND DOES NOT VERBALLY  EXPLAIN WELL.     THEY DO NOT HAVE BLOOD PRESSURE CUFF, HEART MONITOR. PER WIFE, NO APPEARANCE OF SHORTNESS OF BREATHE.  WIFE STATES PATIENT IS NOW A HOSPICE PATIENT - BUT IS STILL HAS [facility]. WIFE STATES SHE CONTACTED HOSPICE NURSE , SHE RECOMMEND CONTACTING OFFICE- POSSIBLE AFIB. PATIENT RECENT HAD MONITOR - PER WIFE DR C. WANTED TO KNOW IF PATIENT HAS MORE  IRREGULAR BEATS.  APPOINTMENT SCHEDULE FOR TODAY AT 11 AM WITH EXTENDERPurcell Nails DNP

## 2018-03-29 NOTE — Patient Instructions (Signed)
Medication Instructions:   Please take you pain and anxiety medication-it will help you considerably.  If you need a refill on your cardiac medications before your next appointment, please call your pharmacy.  Follow-Up: Your physician wants you to follow-up in: 6 months with Dr Sallyanne Kuster   Thank you for choosing [facility]!!

## 2018-03-29 NOTE — Progress Notes (Signed)
Sad. His wife is a Marine scientist. Thank you MCr

## 2018-03-29 NOTE — Telephone Encounter (Signed)
New message   Pt c/o Shortness Of Breath: STAT if SOB developed within the last 24 hours or pt is noticeably SOB on the phone  1. Are you currently SOB (can you hear that pt is SOB on the phone)? Yes, patients wife states that it was worse earlier today.  2. How long have you been experiencing SOB? Patients wife states that it has been on and off for 4 days   3. Are you SOB when sitting or when up moving around? No   4. Are you currently experiencing any other symptoms? Irregular heartbeat, skin color is pale  Patients wife states that he is a hospice patient. Per Ivin Booty route the message to triage.

## 2018-04-01 ENCOUNTER — Telehealth: Payer: Self-pay | Admitting: Cardiovascular Disease

## 2018-04-01 NOTE — Telephone Encounter (Signed)
Called patient and LVM to call back and schedule his followup in December with Dr. Sallyanne Kuster.

## 2018-04-07 ENCOUNTER — Telehealth: Payer: Self-pay | Admitting: *Deleted

## 2018-04-07 NOTE — Telephone Encounter (Signed)
Called to schedule 3-4 month appointment with Dr. Sallyanne Kuster.  Frank Petersen states her husband does not need that appointment, he has spoken with Dr. Sallyanne Kuster and he is doing ok right now.

## 2018-08-18 DEATH — deceased

## 2018-10-24 IMAGING — DX DG HIP (WITH OR WITHOUT PELVIS) 2-3V*R*
3 series · 3 of 3 positions shown · non-contrast
Comparison: December 17, 2015

CLINICAL DATA: Pain following fall.  Known prostate carcinoma

EXAM:
DG HIP (WITH OR WITHOUT PELVIS) 2-3V RIGHT

[pelvis ap]
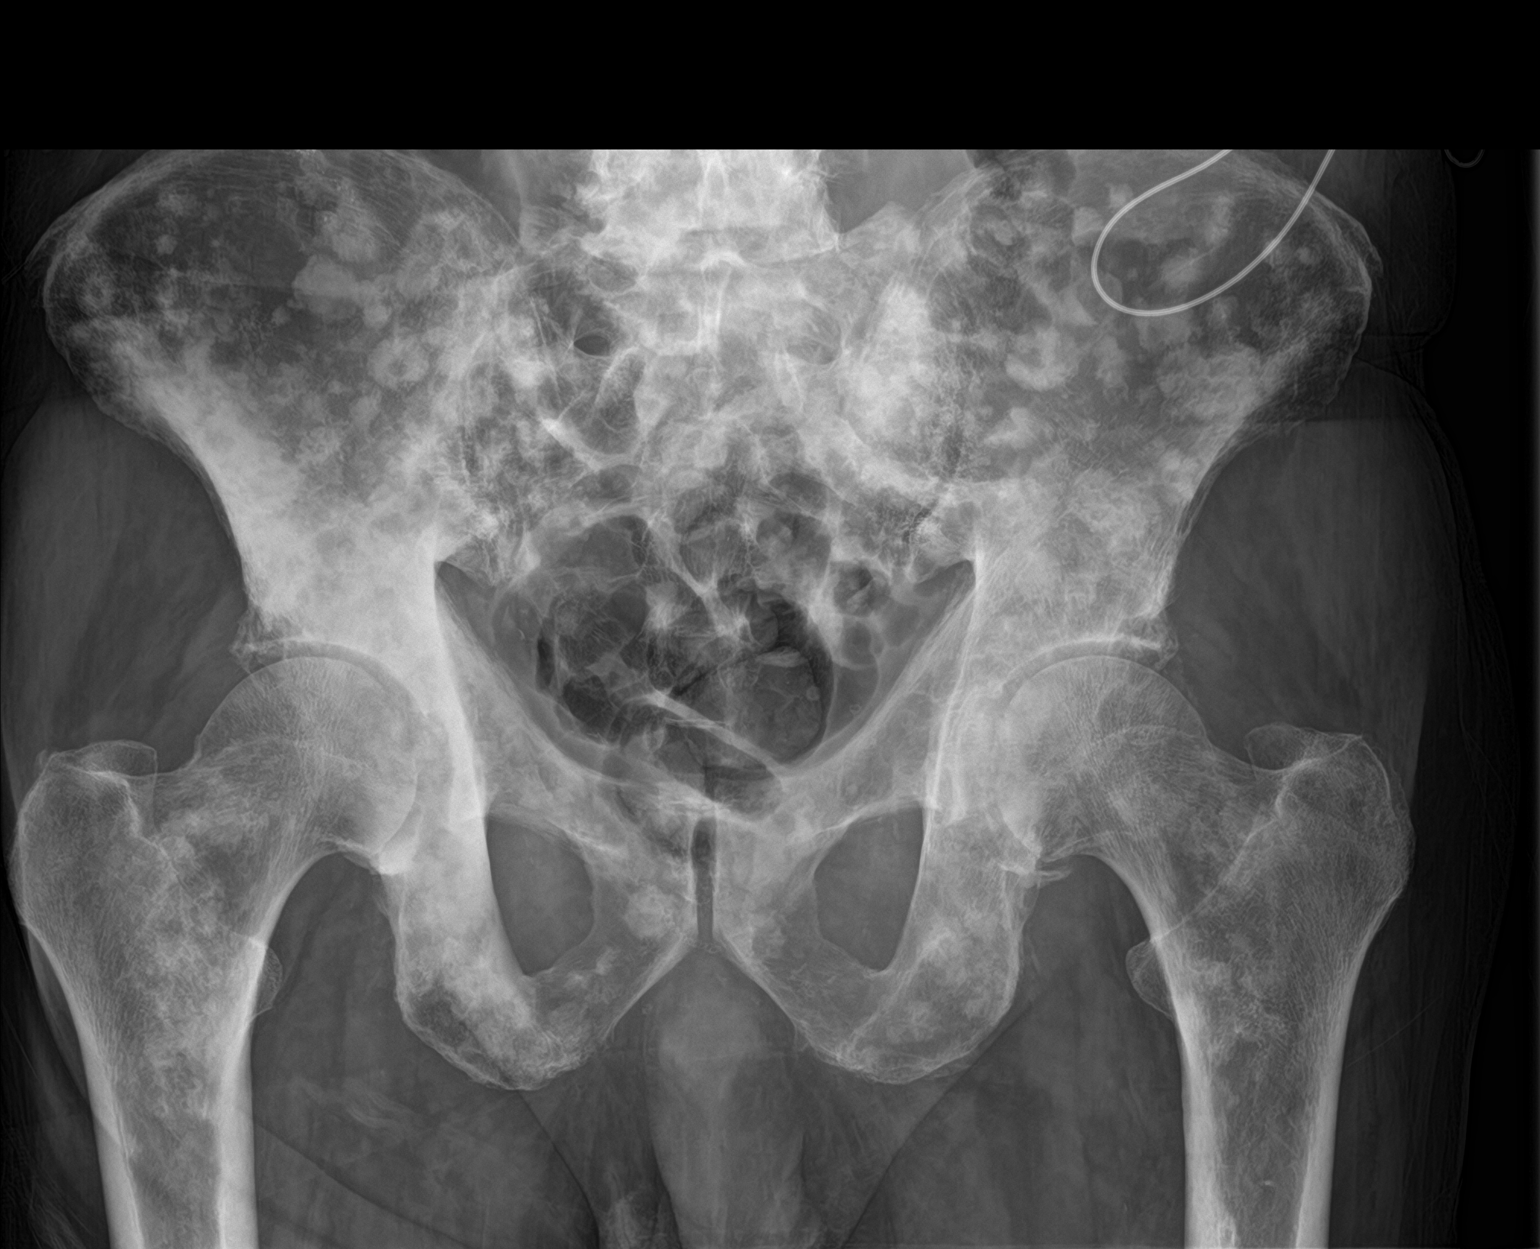

[hip ap]
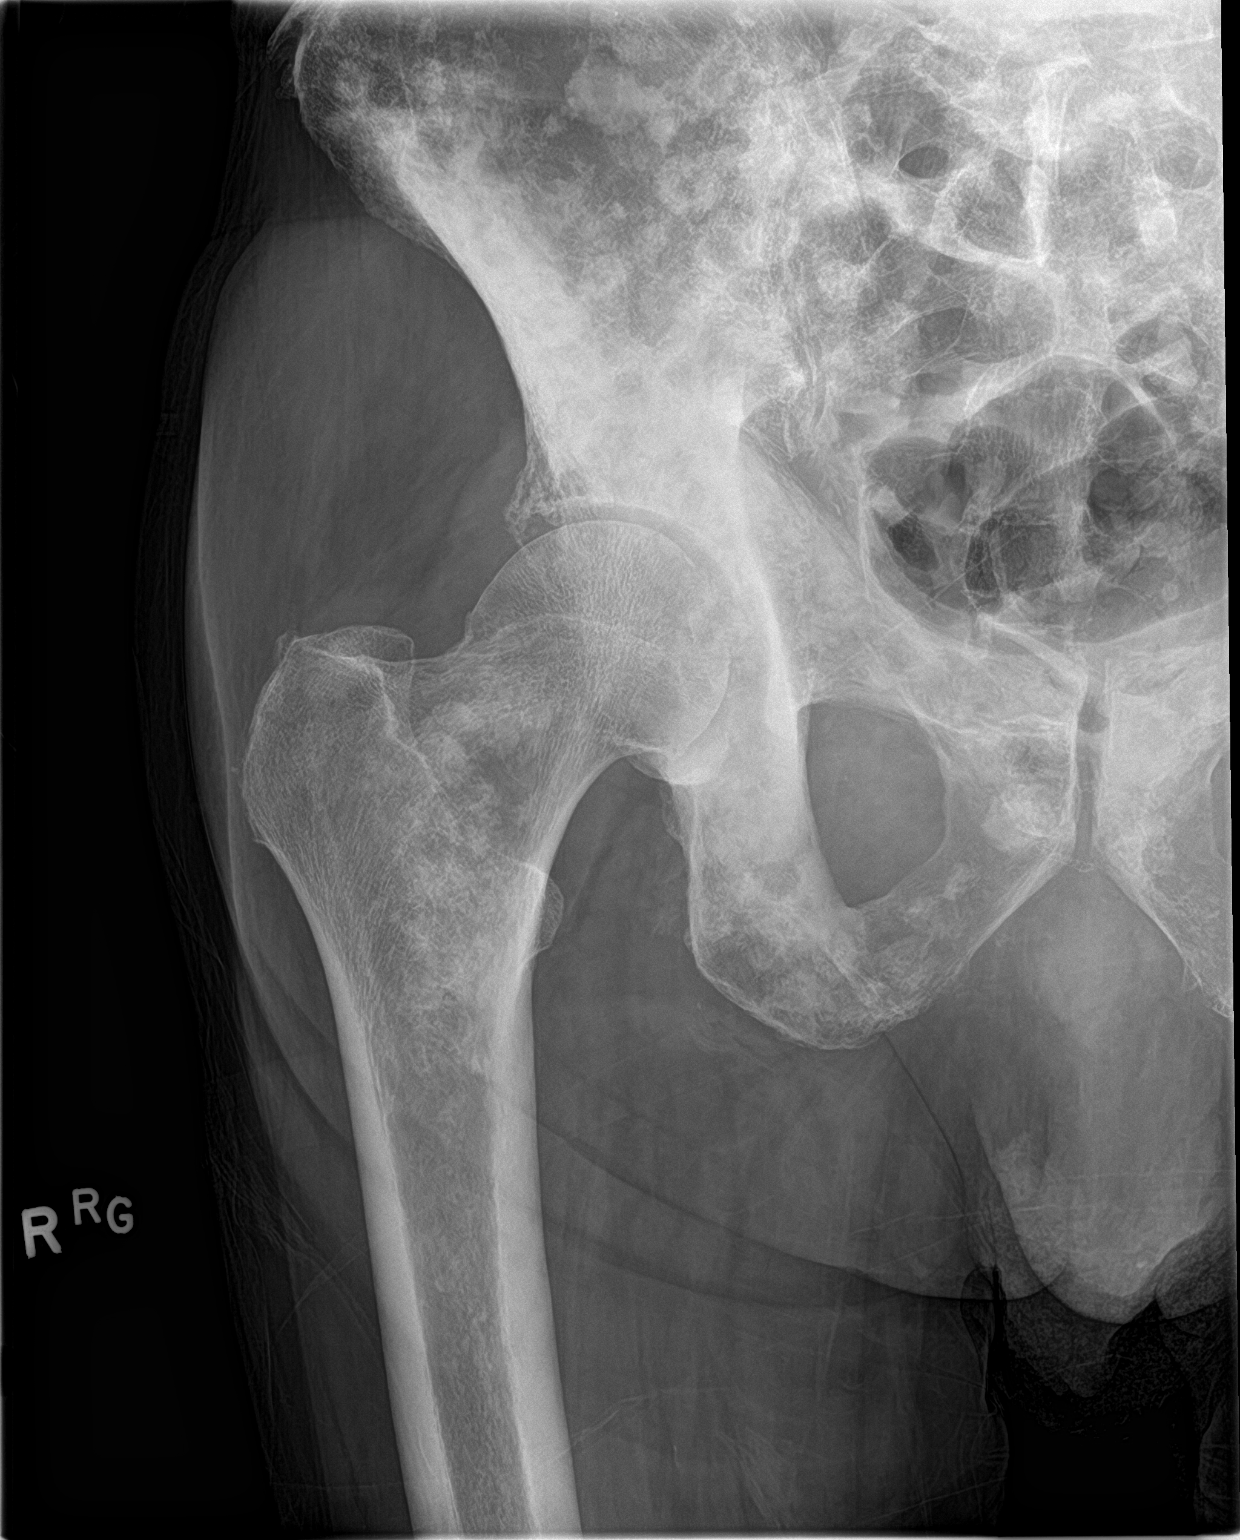

[hip lat]
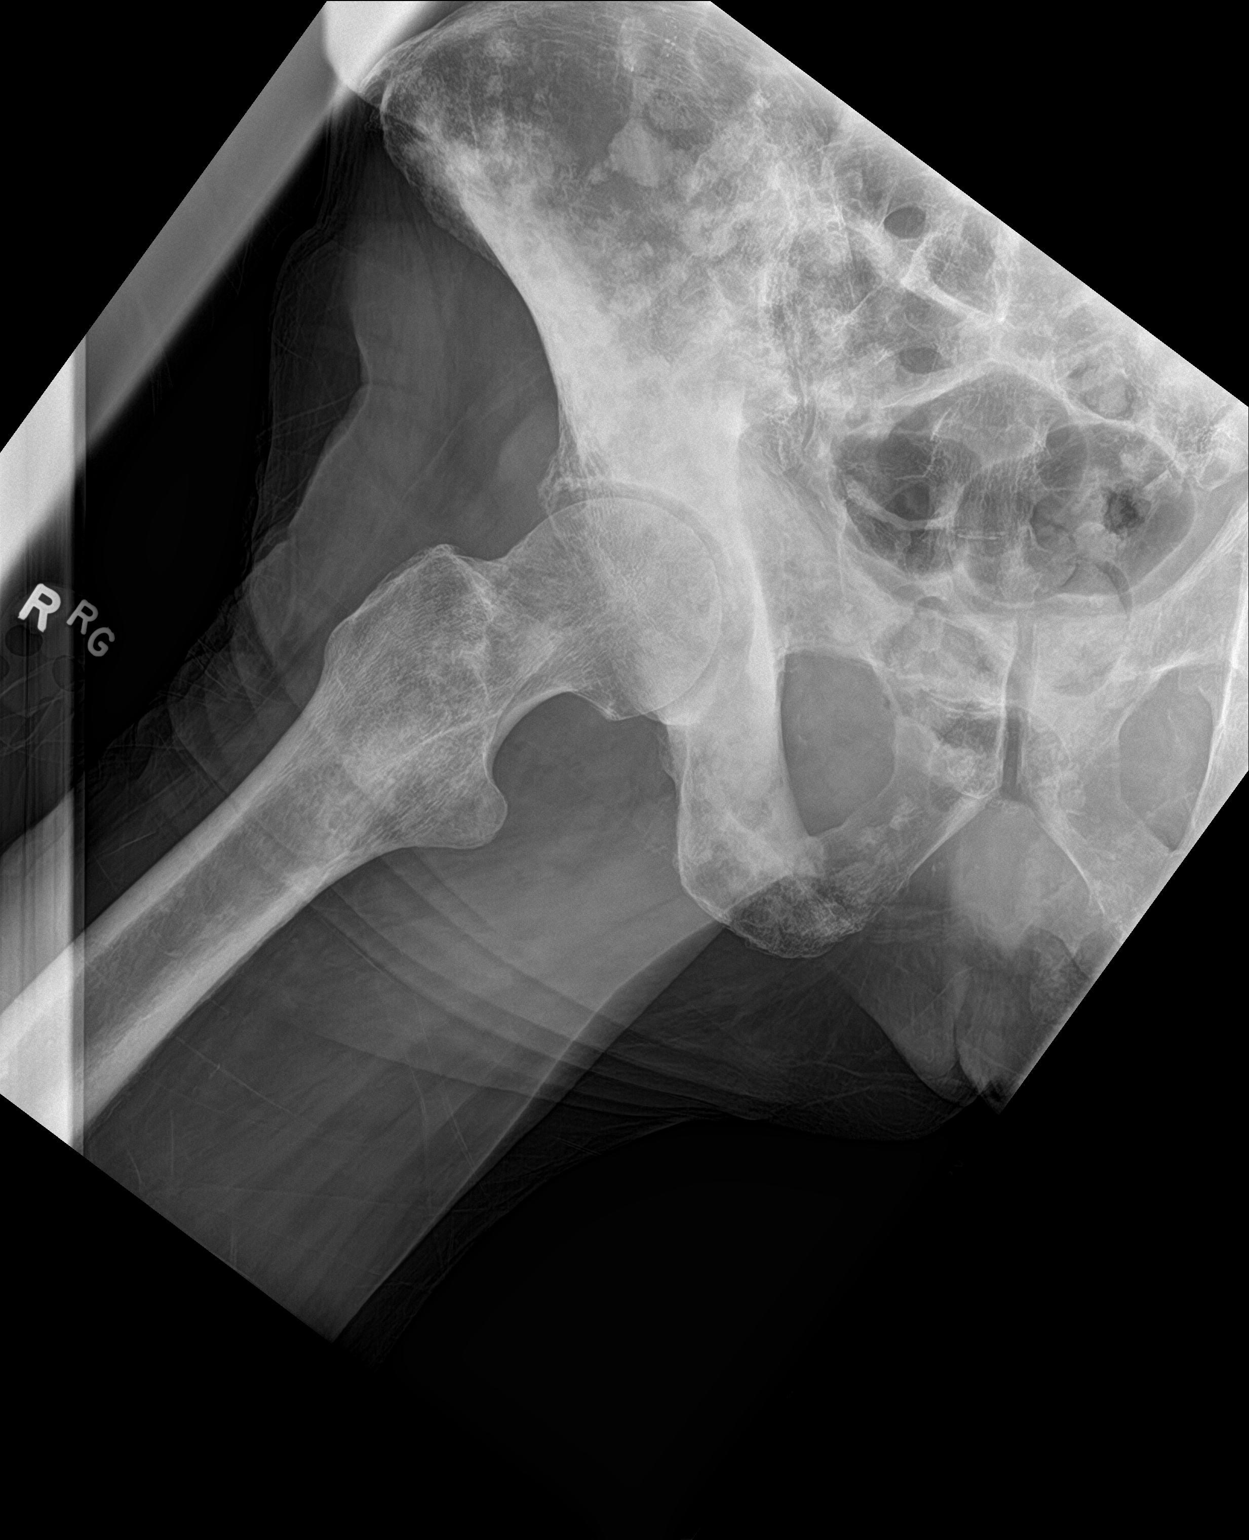

[3 of 3 positions shown; findings below may reference images not displayed]

FINDINGS: Frontal pelvis as well as frontal and lateral right hip images were
obtained. There is no evident fracture or dislocation. There is
widespread sclerotic bony metastatic disease consistent with known
prostate carcinoma. The hip joints and sacroiliac joints appear
normal. There is degenerative change in the lower lumbar spine.
IMPRESSION: Widespread sclerotic bony metastatic disease consistent with
prostate carcinoma. Appearance similar to prior study. No fracture
or dislocation evident.
